# Patient Record
Sex: Female | Born: 1965
Health system: Southern US, Community
[De-identification: ages and names within clinical notes are randomized; demographics above are authoritative.]

## PROBLEM LIST (undated history)

## (undated) DIAGNOSIS — G629 Polyneuropathy, unspecified: Secondary | ICD-10-CM

## (undated) DIAGNOSIS — I10 Essential (primary) hypertension: Secondary | ICD-10-CM

## (undated) DIAGNOSIS — E785 Hyperlipidemia, unspecified: Secondary | ICD-10-CM

## (undated) HISTORY — DX: Essential (primary) hypertension: I10

## (undated) HISTORY — PX: CARPAL TUNNEL RELEASE: SHX101

## (undated) HISTORY — PX: TUBAL LIGATION: SHX77

## (undated) HISTORY — DX: Hyperlipidemia, unspecified: E78.5

## (undated) HISTORY — DX: Polyneuropathy, unspecified: G62.9

---

## 2014-12-12 DIAGNOSIS — I10 Essential (primary) hypertension: Secondary | ICD-10-CM | POA: Diagnosis not present

## 2014-12-12 DIAGNOSIS — I739 Peripheral vascular disease, unspecified: Secondary | ICD-10-CM | POA: Diagnosis not present

## 2014-12-12 DIAGNOSIS — I2510 Atherosclerotic heart disease of native coronary artery without angina pectoris: Secondary | ICD-10-CM | POA: Diagnosis not present

## 2014-12-12 DIAGNOSIS — F17210 Nicotine dependence, cigarettes, uncomplicated: Secondary | ICD-10-CM | POA: Diagnosis not present

## 2014-12-13 DIAGNOSIS — J449 Chronic obstructive pulmonary disease, unspecified: Secondary | ICD-10-CM | POA: Diagnosis not present

## 2014-12-13 DIAGNOSIS — G4733 Obstructive sleep apnea (adult) (pediatric): Secondary | ICD-10-CM | POA: Diagnosis not present

## 2014-12-13 DIAGNOSIS — J302 Other seasonal allergic rhinitis: Secondary | ICD-10-CM | POA: Diagnosis not present

## 2015-01-08 DIAGNOSIS — G8929 Other chronic pain: Secondary | ICD-10-CM | POA: Diagnosis not present

## 2015-01-08 DIAGNOSIS — M4600 Spinal enthesopathy, site unspecified: Secondary | ICD-10-CM | POA: Diagnosis not present

## 2015-01-08 DIAGNOSIS — M791 Myalgia: Secondary | ICD-10-CM | POA: Diagnosis not present

## 2015-01-08 DIAGNOSIS — M542 Cervicalgia: Secondary | ICD-10-CM | POA: Diagnosis not present

## 2015-01-08 DIAGNOSIS — M5416 Radiculopathy, lumbar region: Secondary | ICD-10-CM | POA: Diagnosis not present

## 2015-01-08 DIAGNOSIS — M25559 Pain in unspecified hip: Secondary | ICD-10-CM | POA: Diagnosis not present

## 2015-01-08 DIAGNOSIS — M5137 Other intervertebral disc degeneration, lumbosacral region: Secondary | ICD-10-CM | POA: Diagnosis not present

## 2015-01-08 DIAGNOSIS — M545 Low back pain: Secondary | ICD-10-CM | POA: Diagnosis not present

## 2015-01-16 DIAGNOSIS — G40901 Epilepsy, unspecified, not intractable, with status epilepticus: Secondary | ICD-10-CM | POA: Diagnosis not present

## 2015-01-16 DIAGNOSIS — Z8679 Personal history of other diseases of the circulatory system: Secondary | ICD-10-CM | POA: Diagnosis not present

## 2015-01-16 DIAGNOSIS — E11329 Type 2 diabetes mellitus with mild nonproliferative diabetic retinopathy without macular edema: Secondary | ICD-10-CM | POA: Diagnosis not present

## 2015-01-16 DIAGNOSIS — E139 Other specified diabetes mellitus without complications: Secondary | ICD-10-CM | POA: Diagnosis not present

## 2015-01-16 DIAGNOSIS — I10 Essential (primary) hypertension: Secondary | ICD-10-CM | POA: Diagnosis not present

## 2015-01-16 DIAGNOSIS — E785 Hyperlipidemia, unspecified: Secondary | ICD-10-CM | POA: Diagnosis not present

## 2015-01-16 DIAGNOSIS — R809 Proteinuria, unspecified: Secondary | ICD-10-CM | POA: Diagnosis not present

## 2015-01-16 DIAGNOSIS — E1139 Type 2 diabetes mellitus with other diabetic ophthalmic complication: Secondary | ICD-10-CM | POA: Diagnosis not present

## 2015-01-16 DIAGNOSIS — Z794 Long term (current) use of insulin: Secondary | ICD-10-CM | POA: Diagnosis not present

## 2015-01-16 DIAGNOSIS — E1129 Type 2 diabetes mellitus with other diabetic kidney complication: Secondary | ICD-10-CM | POA: Diagnosis not present

## 2015-01-16 DIAGNOSIS — J449 Chronic obstructive pulmonary disease, unspecified: Secondary | ICD-10-CM | POA: Diagnosis not present

## 2015-01-22 DIAGNOSIS — G4733 Obstructive sleep apnea (adult) (pediatric): Secondary | ICD-10-CM | POA: Diagnosis not present

## 2015-01-24 DIAGNOSIS — G4733 Obstructive sleep apnea (adult) (pediatric): Secondary | ICD-10-CM | POA: Diagnosis not present

## 2015-02-05 DIAGNOSIS — M5137 Other intervertebral disc degeneration, lumbosacral region: Secondary | ICD-10-CM | POA: Diagnosis not present

## 2015-02-05 DIAGNOSIS — M791 Myalgia: Secondary | ICD-10-CM | POA: Diagnosis not present

## 2015-02-05 DIAGNOSIS — M542 Cervicalgia: Secondary | ICD-10-CM | POA: Diagnosis not present

## 2015-02-05 DIAGNOSIS — M4600 Spinal enthesopathy, site unspecified: Secondary | ICD-10-CM | POA: Diagnosis not present

## 2015-02-05 DIAGNOSIS — M5416 Radiculopathy, lumbar region: Secondary | ICD-10-CM | POA: Diagnosis not present

## 2015-02-05 DIAGNOSIS — M545 Low back pain: Secondary | ICD-10-CM | POA: Diagnosis not present

## 2015-02-05 DIAGNOSIS — M25559 Pain in unspecified hip: Secondary | ICD-10-CM | POA: Diagnosis not present

## 2015-02-05 DIAGNOSIS — G8929 Other chronic pain: Secondary | ICD-10-CM | POA: Diagnosis not present

## 2015-02-14 DIAGNOSIS — Z8679 Personal history of other diseases of the circulatory system: Secondary | ICD-10-CM | POA: Diagnosis not present

## 2015-02-14 DIAGNOSIS — G40901 Epilepsy, unspecified, not intractable, with status epilepticus: Secondary | ICD-10-CM | POA: Diagnosis not present

## 2015-02-14 DIAGNOSIS — J449 Chronic obstructive pulmonary disease, unspecified: Secondary | ICD-10-CM | POA: Diagnosis not present

## 2015-02-14 DIAGNOSIS — E139 Other specified diabetes mellitus without complications: Secondary | ICD-10-CM | POA: Diagnosis not present

## 2015-02-20 DIAGNOSIS — G4733 Obstructive sleep apnea (adult) (pediatric): Secondary | ICD-10-CM | POA: Diagnosis not present

## 2015-02-20 DIAGNOSIS — J439 Emphysema, unspecified: Secondary | ICD-10-CM | POA: Diagnosis not present

## 2015-02-20 DIAGNOSIS — Z720 Tobacco use: Secondary | ICD-10-CM | POA: Diagnosis not present

## 2015-02-20 DIAGNOSIS — J449 Chronic obstructive pulmonary disease, unspecified: Secondary | ICD-10-CM | POA: Diagnosis not present

## 2015-02-20 DIAGNOSIS — J302 Other seasonal allergic rhinitis: Secondary | ICD-10-CM | POA: Diagnosis not present

## 2015-02-20 DIAGNOSIS — G4736 Sleep related hypoventilation in conditions classified elsewhere: Secondary | ICD-10-CM | POA: Diagnosis not present

## 2015-03-05 DIAGNOSIS — M791 Myalgia: Secondary | ICD-10-CM | POA: Diagnosis not present

## 2015-03-05 DIAGNOSIS — M25559 Pain in unspecified hip: Secondary | ICD-10-CM | POA: Diagnosis not present

## 2015-03-05 DIAGNOSIS — M5137 Other intervertebral disc degeneration, lumbosacral region: Secondary | ICD-10-CM | POA: Diagnosis not present

## 2015-03-05 DIAGNOSIS — G8929 Other chronic pain: Secondary | ICD-10-CM | POA: Diagnosis not present

## 2015-03-05 DIAGNOSIS — M4600 Spinal enthesopathy, site unspecified: Secondary | ICD-10-CM | POA: Diagnosis not present

## 2015-03-05 DIAGNOSIS — M545 Low back pain: Secondary | ICD-10-CM | POA: Diagnosis not present

## 2015-03-05 DIAGNOSIS — M5416 Radiculopathy, lumbar region: Secondary | ICD-10-CM | POA: Diagnosis not present

## 2015-03-17 DIAGNOSIS — G40901 Epilepsy, unspecified, not intractable, with status epilepticus: Secondary | ICD-10-CM | POA: Diagnosis not present

## 2015-03-17 DIAGNOSIS — J449 Chronic obstructive pulmonary disease, unspecified: Secondary | ICD-10-CM | POA: Diagnosis not present

## 2015-03-17 DIAGNOSIS — E139 Other specified diabetes mellitus without complications: Secondary | ICD-10-CM | POA: Diagnosis not present

## 2015-03-17 DIAGNOSIS — Z8679 Personal history of other diseases of the circulatory system: Secondary | ICD-10-CM | POA: Diagnosis not present

## 2015-03-23 DIAGNOSIS — G4733 Obstructive sleep apnea (adult) (pediatric): Secondary | ICD-10-CM | POA: Diagnosis not present

## 2015-04-02 DIAGNOSIS — G4733 Obstructive sleep apnea (adult) (pediatric): Secondary | ICD-10-CM | POA: Diagnosis not present

## 2015-04-08 DIAGNOSIS — M791 Myalgia: Secondary | ICD-10-CM | POA: Diagnosis not present

## 2015-04-08 DIAGNOSIS — M5137 Other intervertebral disc degeneration, lumbosacral region: Secondary | ICD-10-CM | POA: Diagnosis not present

## 2015-04-08 DIAGNOSIS — M545 Low back pain: Secondary | ICD-10-CM | POA: Diagnosis not present

## 2015-04-08 DIAGNOSIS — Z1389 Encounter for screening for other disorder: Secondary | ICD-10-CM | POA: Diagnosis not present

## 2015-04-08 DIAGNOSIS — M4600 Spinal enthesopathy, site unspecified: Secondary | ICD-10-CM | POA: Diagnosis not present

## 2015-04-08 DIAGNOSIS — G8929 Other chronic pain: Secondary | ICD-10-CM | POA: Diagnosis not present

## 2015-04-08 DIAGNOSIS — M5416 Radiculopathy, lumbar region: Secondary | ICD-10-CM | POA: Diagnosis not present

## 2015-04-08 DIAGNOSIS — M25559 Pain in unspecified hip: Secondary | ICD-10-CM | POA: Diagnosis not present

## 2015-04-08 DIAGNOSIS — F1120 Opioid dependence, uncomplicated: Secondary | ICD-10-CM | POA: Diagnosis not present

## 2015-04-16 DIAGNOSIS — E139 Other specified diabetes mellitus without complications: Secondary | ICD-10-CM | POA: Diagnosis not present

## 2015-04-16 DIAGNOSIS — G40901 Epilepsy, unspecified, not intractable, with status epilepticus: Secondary | ICD-10-CM | POA: Diagnosis not present

## 2015-04-16 DIAGNOSIS — J449 Chronic obstructive pulmonary disease, unspecified: Secondary | ICD-10-CM | POA: Diagnosis not present

## 2015-04-16 DIAGNOSIS — Z8679 Personal history of other diseases of the circulatory system: Secondary | ICD-10-CM | POA: Diagnosis not present

## 2015-04-22 DIAGNOSIS — G4733 Obstructive sleep apnea (adult) (pediatric): Secondary | ICD-10-CM | POA: Diagnosis not present

## 2015-05-09 DIAGNOSIS — M4600 Spinal enthesopathy, site unspecified: Secondary | ICD-10-CM | POA: Diagnosis not present

## 2015-05-09 DIAGNOSIS — M5137 Other intervertebral disc degeneration, lumbosacral region: Secondary | ICD-10-CM | POA: Diagnosis not present

## 2015-05-09 DIAGNOSIS — M5416 Radiculopathy, lumbar region: Secondary | ICD-10-CM | POA: Diagnosis not present

## 2015-05-09 DIAGNOSIS — G8929 Other chronic pain: Secondary | ICD-10-CM | POA: Diagnosis not present

## 2015-05-09 DIAGNOSIS — M25559 Pain in unspecified hip: Secondary | ICD-10-CM | POA: Diagnosis not present

## 2015-05-09 DIAGNOSIS — M545 Low back pain: Secondary | ICD-10-CM | POA: Diagnosis not present

## 2015-05-09 DIAGNOSIS — M791 Myalgia: Secondary | ICD-10-CM | POA: Diagnosis not present

## 2015-05-23 DIAGNOSIS — G4733 Obstructive sleep apnea (adult) (pediatric): Secondary | ICD-10-CM | POA: Diagnosis not present

## 2015-06-06 DIAGNOSIS — Z1389 Encounter for screening for other disorder: Secondary | ICD-10-CM | POA: Diagnosis not present

## 2015-06-06 DIAGNOSIS — M5416 Radiculopathy, lumbar region: Secondary | ICD-10-CM | POA: Diagnosis not present

## 2015-06-06 DIAGNOSIS — M791 Myalgia: Secondary | ICD-10-CM | POA: Diagnosis not present

## 2015-06-06 DIAGNOSIS — M4600 Spinal enthesopathy, site unspecified: Secondary | ICD-10-CM | POA: Diagnosis not present

## 2015-06-06 DIAGNOSIS — M25559 Pain in unspecified hip: Secondary | ICD-10-CM | POA: Diagnosis not present

## 2015-06-06 DIAGNOSIS — G8929 Other chronic pain: Secondary | ICD-10-CM | POA: Diagnosis not present

## 2015-06-06 DIAGNOSIS — M5137 Other intervertebral disc degeneration, lumbosacral region: Secondary | ICD-10-CM | POA: Diagnosis not present

## 2015-06-06 DIAGNOSIS — M545 Low back pain: Secondary | ICD-10-CM | POA: Diagnosis not present

## 2015-06-16 DIAGNOSIS — E139 Other specified diabetes mellitus without complications: Secondary | ICD-10-CM | POA: Diagnosis not present

## 2015-06-16 DIAGNOSIS — Z8679 Personal history of other diseases of the circulatory system: Secondary | ICD-10-CM | POA: Diagnosis not present

## 2015-06-16 DIAGNOSIS — G40901 Epilepsy, unspecified, not intractable, with status epilepticus: Secondary | ICD-10-CM | POA: Diagnosis not present

## 2015-06-16 DIAGNOSIS — J449 Chronic obstructive pulmonary disease, unspecified: Secondary | ICD-10-CM | POA: Diagnosis not present

## 2015-06-20 DIAGNOSIS — I2510 Atherosclerotic heart disease of native coronary artery without angina pectoris: Secondary | ICD-10-CM | POA: Diagnosis not present

## 2015-06-20 DIAGNOSIS — R569 Unspecified convulsions: Secondary | ICD-10-CM | POA: Diagnosis not present

## 2015-06-20 DIAGNOSIS — Z0000 Encounter for general adult medical examination without abnormal findings: Secondary | ICD-10-CM | POA: Diagnosis not present

## 2015-06-20 DIAGNOSIS — Z6825 Body mass index (BMI) 25.0-25.9, adult: Secondary | ICD-10-CM | POA: Diagnosis not present

## 2015-06-20 DIAGNOSIS — R52 Pain, unspecified: Secondary | ICD-10-CM | POA: Diagnosis not present

## 2015-06-20 DIAGNOSIS — E119 Type 2 diabetes mellitus without complications: Secondary | ICD-10-CM | POA: Diagnosis not present

## 2015-06-20 DIAGNOSIS — L739 Follicular disorder, unspecified: Secondary | ICD-10-CM | POA: Diagnosis not present

## 2015-06-22 DIAGNOSIS — G4733 Obstructive sleep apnea (adult) (pediatric): Secondary | ICD-10-CM | POA: Diagnosis not present

## 2015-07-17 DIAGNOSIS — J449 Chronic obstructive pulmonary disease, unspecified: Secondary | ICD-10-CM | POA: Diagnosis not present

## 2015-07-17 DIAGNOSIS — E139 Other specified diabetes mellitus without complications: Secondary | ICD-10-CM | POA: Diagnosis not present

## 2015-07-17 DIAGNOSIS — G40901 Epilepsy, unspecified, not intractable, with status epilepticus: Secondary | ICD-10-CM | POA: Diagnosis not present

## 2015-07-17 DIAGNOSIS — Z8679 Personal history of other diseases of the circulatory system: Secondary | ICD-10-CM | POA: Diagnosis not present

## 2015-07-19 DIAGNOSIS — G4733 Obstructive sleep apnea (adult) (pediatric): Secondary | ICD-10-CM | POA: Diagnosis not present

## 2015-07-23 DIAGNOSIS — I2510 Atherosclerotic heart disease of native coronary artery without angina pectoris: Secondary | ICD-10-CM | POA: Diagnosis not present

## 2015-07-23 DIAGNOSIS — G4733 Obstructive sleep apnea (adult) (pediatric): Secondary | ICD-10-CM | POA: Diagnosis not present

## 2015-07-23 DIAGNOSIS — R569 Unspecified convulsions: Secondary | ICD-10-CM | POA: Diagnosis not present

## 2015-07-23 DIAGNOSIS — R52 Pain, unspecified: Secondary | ICD-10-CM | POA: Diagnosis not present

## 2015-07-23 DIAGNOSIS — E119 Type 2 diabetes mellitus without complications: Secondary | ICD-10-CM | POA: Diagnosis not present

## 2015-07-23 DIAGNOSIS — M549 Dorsalgia, unspecified: Secondary | ICD-10-CM | POA: Diagnosis not present

## 2015-07-23 DIAGNOSIS — I519 Heart disease, unspecified: Secondary | ICD-10-CM | POA: Diagnosis not present

## 2015-07-23 DIAGNOSIS — F319 Bipolar disorder, unspecified: Secondary | ICD-10-CM | POA: Diagnosis not present

## 2015-07-23 DIAGNOSIS — K219 Gastro-esophageal reflux disease without esophagitis: Secondary | ICD-10-CM | POA: Diagnosis not present

## 2015-08-01 DIAGNOSIS — G4736 Sleep related hypoventilation in conditions classified elsewhere: Secondary | ICD-10-CM | POA: Diagnosis not present

## 2015-08-01 DIAGNOSIS — G4733 Obstructive sleep apnea (adult) (pediatric): Secondary | ICD-10-CM | POA: Diagnosis not present

## 2015-08-01 DIAGNOSIS — Z720 Tobacco use: Secondary | ICD-10-CM | POA: Diagnosis not present

## 2015-08-01 DIAGNOSIS — J439 Emphysema, unspecified: Secondary | ICD-10-CM | POA: Diagnosis not present

## 2015-08-01 DIAGNOSIS — J302 Other seasonal allergic rhinitis: Secondary | ICD-10-CM | POA: Diagnosis not present

## 2015-08-01 DIAGNOSIS — J449 Chronic obstructive pulmonary disease, unspecified: Secondary | ICD-10-CM | POA: Diagnosis not present

## 2015-08-07 DIAGNOSIS — E1165 Type 2 diabetes mellitus with hyperglycemia: Secondary | ICD-10-CM | POA: Diagnosis not present

## 2015-08-07 DIAGNOSIS — R110 Nausea: Secondary | ICD-10-CM | POA: Diagnosis not present

## 2015-08-07 DIAGNOSIS — E785 Hyperlipidemia, unspecified: Secondary | ICD-10-CM | POA: Diagnosis not present

## 2015-08-07 DIAGNOSIS — E11329 Type 2 diabetes mellitus with mild nonproliferative diabetic retinopathy without macular edema: Secondary | ICD-10-CM | POA: Diagnosis not present

## 2015-08-07 DIAGNOSIS — Z794 Long term (current) use of insulin: Secondary | ICD-10-CM | POA: Diagnosis not present

## 2015-08-07 DIAGNOSIS — R809 Proteinuria, unspecified: Secondary | ICD-10-CM | POA: Diagnosis not present

## 2015-08-07 DIAGNOSIS — I10 Essential (primary) hypertension: Secondary | ICD-10-CM | POA: Diagnosis not present

## 2015-08-17 DIAGNOSIS — G40901 Epilepsy, unspecified, not intractable, with status epilepticus: Secondary | ICD-10-CM | POA: Diagnosis not present

## 2015-08-17 DIAGNOSIS — Z8679 Personal history of other diseases of the circulatory system: Secondary | ICD-10-CM | POA: Diagnosis not present

## 2015-08-17 DIAGNOSIS — J449 Chronic obstructive pulmonary disease, unspecified: Secondary | ICD-10-CM | POA: Diagnosis not present

## 2015-08-17 DIAGNOSIS — E139 Other specified diabetes mellitus without complications: Secondary | ICD-10-CM | POA: Diagnosis not present

## 2015-08-22 DIAGNOSIS — Z6825 Body mass index (BMI) 25.0-25.9, adult: Secondary | ICD-10-CM | POA: Diagnosis not present

## 2015-08-22 DIAGNOSIS — M549 Dorsalgia, unspecified: Secondary | ICD-10-CM | POA: Diagnosis not present

## 2015-08-23 DIAGNOSIS — G4733 Obstructive sleep apnea (adult) (pediatric): Secondary | ICD-10-CM | POA: Diagnosis not present

## 2015-09-16 DIAGNOSIS — Z8679 Personal history of other diseases of the circulatory system: Secondary | ICD-10-CM | POA: Diagnosis not present

## 2015-09-16 DIAGNOSIS — E139 Other specified diabetes mellitus without complications: Secondary | ICD-10-CM | POA: Diagnosis not present

## 2015-09-16 DIAGNOSIS — J449 Chronic obstructive pulmonary disease, unspecified: Secondary | ICD-10-CM | POA: Diagnosis not present

## 2015-09-16 DIAGNOSIS — G40901 Epilepsy, unspecified, not intractable, with status epilepticus: Secondary | ICD-10-CM | POA: Diagnosis not present

## 2015-09-20 DIAGNOSIS — M549 Dorsalgia, unspecified: Secondary | ICD-10-CM | POA: Diagnosis not present

## 2015-09-22 DIAGNOSIS — G4733 Obstructive sleep apnea (adult) (pediatric): Secondary | ICD-10-CM | POA: Diagnosis not present

## 2015-10-17 DIAGNOSIS — E139 Other specified diabetes mellitus without complications: Secondary | ICD-10-CM | POA: Diagnosis not present

## 2015-10-17 DIAGNOSIS — Z8679 Personal history of other diseases of the circulatory system: Secondary | ICD-10-CM | POA: Diagnosis not present

## 2015-10-17 DIAGNOSIS — J449 Chronic obstructive pulmonary disease, unspecified: Secondary | ICD-10-CM | POA: Diagnosis not present

## 2015-10-17 DIAGNOSIS — G40901 Epilepsy, unspecified, not intractable, with status epilepticus: Secondary | ICD-10-CM | POA: Diagnosis not present

## 2015-10-21 DIAGNOSIS — M549 Dorsalgia, unspecified: Secondary | ICD-10-CM | POA: Diagnosis not present

## 2015-10-21 DIAGNOSIS — Z6826 Body mass index (BMI) 26.0-26.9, adult: Secondary | ICD-10-CM | POA: Diagnosis not present

## 2015-10-21 DIAGNOSIS — Z1389 Encounter for screening for other disorder: Secondary | ICD-10-CM | POA: Diagnosis not present

## 2015-10-21 DIAGNOSIS — I2510 Atherosclerotic heart disease of native coronary artery without angina pectoris: Secondary | ICD-10-CM | POA: Diagnosis not present

## 2015-10-21 DIAGNOSIS — K219 Gastro-esophageal reflux disease without esophagitis: Secondary | ICD-10-CM | POA: Diagnosis not present

## 2015-10-21 DIAGNOSIS — R0989 Other specified symptoms and signs involving the circulatory and respiratory systems: Secondary | ICD-10-CM | POA: Diagnosis not present

## 2015-10-23 DIAGNOSIS — G4733 Obstructive sleep apnea (adult) (pediatric): Secondary | ICD-10-CM | POA: Diagnosis not present

## 2015-11-04 DIAGNOSIS — K529 Noninfective gastroenteritis and colitis, unspecified: Secondary | ICD-10-CM | POA: Diagnosis not present

## 2015-11-14 DIAGNOSIS — G4733 Obstructive sleep apnea (adult) (pediatric): Secondary | ICD-10-CM | POA: Diagnosis not present

## 2015-11-16 DIAGNOSIS — G40901 Epilepsy, unspecified, not intractable, with status epilepticus: Secondary | ICD-10-CM | POA: Diagnosis not present

## 2015-11-16 DIAGNOSIS — J449 Chronic obstructive pulmonary disease, unspecified: Secondary | ICD-10-CM | POA: Diagnosis not present

## 2015-11-16 DIAGNOSIS — Z8679 Personal history of other diseases of the circulatory system: Secondary | ICD-10-CM | POA: Diagnosis not present

## 2015-11-16 DIAGNOSIS — E139 Other specified diabetes mellitus without complications: Secondary | ICD-10-CM | POA: Diagnosis not present

## 2015-11-20 DIAGNOSIS — M549 Dorsalgia, unspecified: Secondary | ICD-10-CM | POA: Diagnosis not present

## 2015-11-20 DIAGNOSIS — J42 Unspecified chronic bronchitis: Secondary | ICD-10-CM | POA: Diagnosis not present

## 2015-12-10 DIAGNOSIS — I6523 Occlusion and stenosis of bilateral carotid arteries: Secondary | ICD-10-CM | POA: Diagnosis not present

## 2015-12-17 DIAGNOSIS — J449 Chronic obstructive pulmonary disease, unspecified: Secondary | ICD-10-CM | POA: Diagnosis not present

## 2015-12-17 DIAGNOSIS — E139 Other specified diabetes mellitus without complications: Secondary | ICD-10-CM | POA: Diagnosis not present

## 2015-12-17 DIAGNOSIS — Z8679 Personal history of other diseases of the circulatory system: Secondary | ICD-10-CM | POA: Diagnosis not present

## 2015-12-17 DIAGNOSIS — G40901 Epilepsy, unspecified, not intractable, with status epilepticus: Secondary | ICD-10-CM | POA: Diagnosis not present

## 2015-12-20 DIAGNOSIS — M549 Dorsalgia, unspecified: Secondary | ICD-10-CM | POA: Diagnosis not present

## 2015-12-20 DIAGNOSIS — Z6826 Body mass index (BMI) 26.0-26.9, adult: Secondary | ICD-10-CM | POA: Diagnosis not present

## 2015-12-20 DIAGNOSIS — F319 Bipolar disorder, unspecified: Secondary | ICD-10-CM | POA: Diagnosis not present

## 2015-12-20 DIAGNOSIS — R569 Unspecified convulsions: Secondary | ICD-10-CM | POA: Diagnosis not present

## 2015-12-23 DIAGNOSIS — J449 Chronic obstructive pulmonary disease, unspecified: Secondary | ICD-10-CM | POA: Diagnosis not present

## 2015-12-23 DIAGNOSIS — J439 Emphysema, unspecified: Secondary | ICD-10-CM | POA: Diagnosis not present

## 2015-12-23 DIAGNOSIS — G4736 Sleep related hypoventilation in conditions classified elsewhere: Secondary | ICD-10-CM | POA: Diagnosis not present

## 2015-12-23 DIAGNOSIS — G4733 Obstructive sleep apnea (adult) (pediatric): Secondary | ICD-10-CM | POA: Diagnosis not present

## 2015-12-23 DIAGNOSIS — Z720 Tobacco use: Secondary | ICD-10-CM | POA: Diagnosis not present

## 2016-01-15 DIAGNOSIS — M5416 Radiculopathy, lumbar region: Secondary | ICD-10-CM | POA: Diagnosis not present

## 2016-01-15 DIAGNOSIS — G8929 Other chronic pain: Secondary | ICD-10-CM | POA: Diagnosis not present

## 2016-01-15 DIAGNOSIS — Z79899 Other long term (current) drug therapy: Secondary | ICD-10-CM | POA: Diagnosis not present

## 2016-01-17 DIAGNOSIS — E139 Other specified diabetes mellitus without complications: Secondary | ICD-10-CM | POA: Diagnosis not present

## 2016-01-17 DIAGNOSIS — J449 Chronic obstructive pulmonary disease, unspecified: Secondary | ICD-10-CM | POA: Diagnosis not present

## 2016-01-17 DIAGNOSIS — Z8679 Personal history of other diseases of the circulatory system: Secondary | ICD-10-CM | POA: Diagnosis not present

## 2016-01-17 DIAGNOSIS — G40901 Epilepsy, unspecified, not intractable, with status epilepticus: Secondary | ICD-10-CM | POA: Diagnosis not present

## 2016-02-10 DIAGNOSIS — G8929 Other chronic pain: Secondary | ICD-10-CM | POA: Diagnosis not present

## 2016-02-10 DIAGNOSIS — Z79899 Other long term (current) drug therapy: Secondary | ICD-10-CM | POA: Diagnosis not present

## 2016-02-10 DIAGNOSIS — M5416 Radiculopathy, lumbar region: Secondary | ICD-10-CM | POA: Diagnosis not present

## 2016-02-11 DIAGNOSIS — I10 Essential (primary) hypertension: Secondary | ICD-10-CM | POA: Diagnosis not present

## 2016-02-11 DIAGNOSIS — Z794 Long term (current) use of insulin: Secondary | ICD-10-CM | POA: Diagnosis not present

## 2016-02-11 DIAGNOSIS — E1165 Type 2 diabetes mellitus with hyperglycemia: Secondary | ICD-10-CM | POA: Diagnosis not present

## 2016-02-11 DIAGNOSIS — E782 Mixed hyperlipidemia: Secondary | ICD-10-CM | POA: Diagnosis not present

## 2016-02-14 DIAGNOSIS — J449 Chronic obstructive pulmonary disease, unspecified: Secondary | ICD-10-CM | POA: Diagnosis not present

## 2016-02-14 DIAGNOSIS — E139 Other specified diabetes mellitus without complications: Secondary | ICD-10-CM | POA: Diagnosis not present

## 2016-02-14 DIAGNOSIS — G40901 Epilepsy, unspecified, not intractable, with status epilepticus: Secondary | ICD-10-CM | POA: Diagnosis not present

## 2016-02-14 DIAGNOSIS — Z8679 Personal history of other diseases of the circulatory system: Secondary | ICD-10-CM | POA: Diagnosis not present

## 2016-03-12 DIAGNOSIS — M5416 Radiculopathy, lumbar region: Secondary | ICD-10-CM | POA: Diagnosis not present

## 2016-03-12 DIAGNOSIS — G8929 Other chronic pain: Secondary | ICD-10-CM | POA: Diagnosis not present

## 2016-03-12 DIAGNOSIS — G4733 Obstructive sleep apnea (adult) (pediatric): Secondary | ICD-10-CM | POA: Diagnosis not present

## 2016-03-12 DIAGNOSIS — Z79899 Other long term (current) drug therapy: Secondary | ICD-10-CM | POA: Diagnosis not present

## 2016-03-16 DIAGNOSIS — E139 Other specified diabetes mellitus without complications: Secondary | ICD-10-CM | POA: Diagnosis not present

## 2016-03-16 DIAGNOSIS — Z8679 Personal history of other diseases of the circulatory system: Secondary | ICD-10-CM | POA: Diagnosis not present

## 2016-03-16 DIAGNOSIS — G40901 Epilepsy, unspecified, not intractable, with status epilepticus: Secondary | ICD-10-CM | POA: Diagnosis not present

## 2016-03-16 DIAGNOSIS — J449 Chronic obstructive pulmonary disease, unspecified: Secondary | ICD-10-CM | POA: Diagnosis not present

## 2016-04-03 DIAGNOSIS — J309 Allergic rhinitis, unspecified: Secondary | ICD-10-CM | POA: Diagnosis not present

## 2016-04-03 DIAGNOSIS — G4733 Obstructive sleep apnea (adult) (pediatric): Secondary | ICD-10-CM | POA: Diagnosis not present

## 2016-04-03 DIAGNOSIS — Z720 Tobacco use: Secondary | ICD-10-CM | POA: Diagnosis not present

## 2016-04-03 DIAGNOSIS — J449 Chronic obstructive pulmonary disease, unspecified: Secondary | ICD-10-CM | POA: Diagnosis not present

## 2016-04-03 DIAGNOSIS — G4736 Sleep related hypoventilation in conditions classified elsewhere: Secondary | ICD-10-CM | POA: Diagnosis not present

## 2016-04-03 DIAGNOSIS — J439 Emphysema, unspecified: Secondary | ICD-10-CM | POA: Diagnosis not present

## 2016-04-10 DIAGNOSIS — G8929 Other chronic pain: Secondary | ICD-10-CM | POA: Diagnosis not present

## 2016-04-10 DIAGNOSIS — M5416 Radiculopathy, lumbar region: Secondary | ICD-10-CM | POA: Diagnosis not present

## 2016-04-10 DIAGNOSIS — Z79899 Other long term (current) drug therapy: Secondary | ICD-10-CM | POA: Diagnosis not present

## 2016-04-15 DIAGNOSIS — E139 Other specified diabetes mellitus without complications: Secondary | ICD-10-CM | POA: Diagnosis not present

## 2016-04-15 DIAGNOSIS — G40901 Epilepsy, unspecified, not intractable, with status epilepticus: Secondary | ICD-10-CM | POA: Diagnosis not present

## 2016-04-15 DIAGNOSIS — Z8679 Personal history of other diseases of the circulatory system: Secondary | ICD-10-CM | POA: Diagnosis not present

## 2016-04-15 DIAGNOSIS — J449 Chronic obstructive pulmonary disease, unspecified: Secondary | ICD-10-CM | POA: Diagnosis not present

## 2016-04-30 DIAGNOSIS — G4733 Obstructive sleep apnea (adult) (pediatric): Secondary | ICD-10-CM | POA: Diagnosis not present

## 2016-05-14 DIAGNOSIS — Z79899 Other long term (current) drug therapy: Secondary | ICD-10-CM | POA: Diagnosis not present

## 2016-05-14 DIAGNOSIS — G8929 Other chronic pain: Secondary | ICD-10-CM | POA: Diagnosis not present

## 2016-05-14 DIAGNOSIS — M5416 Radiculopathy, lumbar region: Secondary | ICD-10-CM | POA: Diagnosis not present

## 2016-05-16 DIAGNOSIS — J449 Chronic obstructive pulmonary disease, unspecified: Secondary | ICD-10-CM | POA: Diagnosis not present

## 2016-05-16 DIAGNOSIS — Z8679 Personal history of other diseases of the circulatory system: Secondary | ICD-10-CM | POA: Diagnosis not present

## 2016-05-16 DIAGNOSIS — E139 Other specified diabetes mellitus without complications: Secondary | ICD-10-CM | POA: Diagnosis not present

## 2016-05-16 DIAGNOSIS — G40901 Epilepsy, unspecified, not intractable, with status epilepticus: Secondary | ICD-10-CM | POA: Diagnosis not present

## 2016-06-12 DIAGNOSIS — G8929 Other chronic pain: Secondary | ICD-10-CM | POA: Diagnosis not present

## 2016-06-12 DIAGNOSIS — Z79899 Other long term (current) drug therapy: Secondary | ICD-10-CM | POA: Diagnosis not present

## 2016-06-12 DIAGNOSIS — M5416 Radiculopathy, lumbar region: Secondary | ICD-10-CM | POA: Diagnosis not present

## 2016-07-01 DIAGNOSIS — G4733 Obstructive sleep apnea (adult) (pediatric): Secondary | ICD-10-CM | POA: Diagnosis not present

## 2016-07-13 DIAGNOSIS — G8929 Other chronic pain: Secondary | ICD-10-CM | POA: Diagnosis not present

## 2016-07-13 DIAGNOSIS — Z79899 Other long term (current) drug therapy: Secondary | ICD-10-CM | POA: Diagnosis not present

## 2016-07-13 DIAGNOSIS — M5416 Radiculopathy, lumbar region: Secondary | ICD-10-CM | POA: Diagnosis not present

## 2016-07-14 DIAGNOSIS — E113293 Type 2 diabetes mellitus with mild nonproliferative diabetic retinopathy without macular edema, bilateral: Secondary | ICD-10-CM | POA: Diagnosis not present

## 2016-07-14 DIAGNOSIS — E1165 Type 2 diabetes mellitus with hyperglycemia: Secondary | ICD-10-CM | POA: Diagnosis not present

## 2016-07-14 DIAGNOSIS — I10 Essential (primary) hypertension: Secondary | ICD-10-CM | POA: Diagnosis not present

## 2016-07-14 DIAGNOSIS — E785 Hyperlipidemia, unspecified: Secondary | ICD-10-CM | POA: Diagnosis not present

## 2016-07-14 DIAGNOSIS — Z794 Long term (current) use of insulin: Secondary | ICD-10-CM | POA: Diagnosis not present

## 2016-07-16 DIAGNOSIS — G40901 Epilepsy, unspecified, not intractable, with status epilepticus: Secondary | ICD-10-CM | POA: Diagnosis not present

## 2016-07-16 DIAGNOSIS — J449 Chronic obstructive pulmonary disease, unspecified: Secondary | ICD-10-CM | POA: Diagnosis not present

## 2016-07-16 DIAGNOSIS — E139 Other specified diabetes mellitus without complications: Secondary | ICD-10-CM | POA: Diagnosis not present

## 2016-07-16 DIAGNOSIS — Z8679 Personal history of other diseases of the circulatory system: Secondary | ICD-10-CM | POA: Diagnosis not present

## 2016-08-11 DIAGNOSIS — M5416 Radiculopathy, lumbar region: Secondary | ICD-10-CM | POA: Diagnosis not present

## 2016-08-11 DIAGNOSIS — G8929 Other chronic pain: Secondary | ICD-10-CM | POA: Diagnosis not present

## 2016-08-11 DIAGNOSIS — Z79899 Other long term (current) drug therapy: Secondary | ICD-10-CM | POA: Diagnosis not present

## 2016-08-19 DIAGNOSIS — G629 Polyneuropathy, unspecified: Secondary | ICD-10-CM | POA: Diagnosis not present

## 2016-08-19 DIAGNOSIS — Z8669 Personal history of other diseases of the nervous system and sense organs: Secondary | ICD-10-CM | POA: Diagnosis not present

## 2016-08-19 DIAGNOSIS — K219 Gastro-esophageal reflux disease without esophagitis: Secondary | ICD-10-CM | POA: Diagnosis not present

## 2016-08-19 DIAGNOSIS — Z8673 Personal history of transient ischemic attack (TIA), and cerebral infarction without residual deficits: Secondary | ICD-10-CM | POA: Diagnosis not present

## 2016-08-19 DIAGNOSIS — F319 Bipolar disorder, unspecified: Secondary | ICD-10-CM | POA: Diagnosis not present

## 2016-08-19 DIAGNOSIS — J449 Chronic obstructive pulmonary disease, unspecified: Secondary | ICD-10-CM | POA: Diagnosis not present

## 2016-08-19 DIAGNOSIS — E119 Type 2 diabetes mellitus without complications: Secondary | ICD-10-CM | POA: Diagnosis not present

## 2016-08-19 DIAGNOSIS — F520 Hypoactive sexual desire disorder: Secondary | ICD-10-CM | POA: Diagnosis not present

## 2016-08-19 DIAGNOSIS — I252 Old myocardial infarction: Secondary | ICD-10-CM | POA: Diagnosis not present

## 2016-08-24 DIAGNOSIS — Z1231 Encounter for screening mammogram for malignant neoplasm of breast: Secondary | ICD-10-CM | POA: Diagnosis not present

## 2016-09-09 DIAGNOSIS — G4733 Obstructive sleep apnea (adult) (pediatric): Secondary | ICD-10-CM | POA: Diagnosis not present

## 2016-09-09 DIAGNOSIS — G4736 Sleep related hypoventilation in conditions classified elsewhere: Secondary | ICD-10-CM | POA: Diagnosis not present

## 2016-09-09 DIAGNOSIS — J439 Emphysema, unspecified: Secondary | ICD-10-CM | POA: Diagnosis not present

## 2016-09-09 DIAGNOSIS — F17210 Nicotine dependence, cigarettes, uncomplicated: Secondary | ICD-10-CM | POA: Diagnosis not present

## 2016-09-09 DIAGNOSIS — J449 Chronic obstructive pulmonary disease, unspecified: Secondary | ICD-10-CM | POA: Diagnosis not present

## 2016-09-10 DIAGNOSIS — G8929 Other chronic pain: Secondary | ICD-10-CM | POA: Diagnosis not present

## 2016-09-10 DIAGNOSIS — M5416 Radiculopathy, lumbar region: Secondary | ICD-10-CM | POA: Diagnosis not present

## 2016-09-10 DIAGNOSIS — Z79899 Other long term (current) drug therapy: Secondary | ICD-10-CM | POA: Diagnosis not present

## 2016-10-02 DIAGNOSIS — F3131 Bipolar disorder, current episode depressed, mild: Secondary | ICD-10-CM | POA: Diagnosis not present

## 2016-10-02 DIAGNOSIS — G40309 Generalized idiopathic epilepsy and epileptic syndromes, not intractable, without status epilepticus: Secondary | ICD-10-CM | POA: Diagnosis not present

## 2016-10-10 DIAGNOSIS — G8929 Other chronic pain: Secondary | ICD-10-CM | POA: Diagnosis not present

## 2016-10-10 DIAGNOSIS — Z79899 Other long term (current) drug therapy: Secondary | ICD-10-CM | POA: Diagnosis not present

## 2016-10-10 DIAGNOSIS — M5416 Radiculopathy, lumbar region: Secondary | ICD-10-CM | POA: Diagnosis not present

## 2016-11-07 DIAGNOSIS — M5416 Radiculopathy, lumbar region: Secondary | ICD-10-CM | POA: Diagnosis not present

## 2016-11-07 DIAGNOSIS — G8929 Other chronic pain: Secondary | ICD-10-CM | POA: Diagnosis not present

## 2016-11-07 DIAGNOSIS — Z79899 Other long term (current) drug therapy: Secondary | ICD-10-CM | POA: Diagnosis not present

## 2016-11-12 DIAGNOSIS — J449 Chronic obstructive pulmonary disease, unspecified: Secondary | ICD-10-CM | POA: Diagnosis not present

## 2016-11-18 DIAGNOSIS — E663 Overweight: Secondary | ICD-10-CM | POA: Diagnosis not present

## 2016-11-18 DIAGNOSIS — G629 Polyneuropathy, unspecified: Secondary | ICD-10-CM | POA: Diagnosis not present

## 2016-11-18 DIAGNOSIS — E119 Type 2 diabetes mellitus without complications: Secondary | ICD-10-CM | POA: Diagnosis not present

## 2016-11-18 DIAGNOSIS — Z6825 Body mass index (BMI) 25.0-25.9, adult: Secondary | ICD-10-CM | POA: Diagnosis not present

## 2016-11-18 DIAGNOSIS — G8929 Other chronic pain: Secondary | ICD-10-CM | POA: Diagnosis not present

## 2016-11-18 DIAGNOSIS — Z1389 Encounter for screening for other disorder: Secondary | ICD-10-CM | POA: Diagnosis not present

## 2016-12-09 DIAGNOSIS — G8929 Other chronic pain: Secondary | ICD-10-CM | POA: Diagnosis not present

## 2016-12-09 DIAGNOSIS — M5416 Radiculopathy, lumbar region: Secondary | ICD-10-CM | POA: Diagnosis not present

## 2016-12-09 DIAGNOSIS — Z79899 Other long term (current) drug therapy: Secondary | ICD-10-CM | POA: Diagnosis not present

## 2016-12-11 DIAGNOSIS — J0100 Acute maxillary sinusitis, unspecified: Secondary | ICD-10-CM | POA: Diagnosis not present

## 2016-12-15 DIAGNOSIS — E113293 Type 2 diabetes mellitus with mild nonproliferative diabetic retinopathy without macular edema, bilateral: Secondary | ICD-10-CM | POA: Diagnosis not present

## 2016-12-15 DIAGNOSIS — E785 Hyperlipidemia, unspecified: Secondary | ICD-10-CM | POA: Diagnosis not present

## 2016-12-15 DIAGNOSIS — E1165 Type 2 diabetes mellitus with hyperglycemia: Secondary | ICD-10-CM | POA: Diagnosis not present

## 2016-12-15 DIAGNOSIS — Z794 Long term (current) use of insulin: Secondary | ICD-10-CM | POA: Diagnosis not present

## 2016-12-15 DIAGNOSIS — I10 Essential (primary) hypertension: Secondary | ICD-10-CM | POA: Diagnosis not present

## 2016-12-30 DIAGNOSIS — G4733 Obstructive sleep apnea (adult) (pediatric): Secondary | ICD-10-CM | POA: Diagnosis not present

## 2016-12-30 DIAGNOSIS — J439 Emphysema, unspecified: Secondary | ICD-10-CM | POA: Diagnosis not present

## 2016-12-30 DIAGNOSIS — F17210 Nicotine dependence, cigarettes, uncomplicated: Secondary | ICD-10-CM | POA: Diagnosis not present

## 2016-12-30 DIAGNOSIS — J449 Chronic obstructive pulmonary disease, unspecified: Secondary | ICD-10-CM | POA: Diagnosis not present

## 2016-12-30 DIAGNOSIS — G4736 Sleep related hypoventilation in conditions classified elsewhere: Secondary | ICD-10-CM | POA: Diagnosis not present

## 2017-01-07 DIAGNOSIS — G8929 Other chronic pain: Secondary | ICD-10-CM | POA: Diagnosis not present

## 2017-01-07 DIAGNOSIS — Z79899 Other long term (current) drug therapy: Secondary | ICD-10-CM | POA: Diagnosis not present

## 2017-01-07 DIAGNOSIS — M5416 Radiculopathy, lumbar region: Secondary | ICD-10-CM | POA: Diagnosis not present

## 2017-01-22 DIAGNOSIS — G4733 Obstructive sleep apnea (adult) (pediatric): Secondary | ICD-10-CM | POA: Diagnosis not present

## 2017-02-10 DIAGNOSIS — M5416 Radiculopathy, lumbar region: Secondary | ICD-10-CM | POA: Diagnosis not present

## 2017-02-10 DIAGNOSIS — G8929 Other chronic pain: Secondary | ICD-10-CM | POA: Diagnosis not present

## 2017-03-09 DIAGNOSIS — J0100 Acute maxillary sinusitis, unspecified: Secondary | ICD-10-CM | POA: Diagnosis not present

## 2017-03-12 DIAGNOSIS — M5416 Radiculopathy, lumbar region: Secondary | ICD-10-CM | POA: Diagnosis not present

## 2017-03-12 DIAGNOSIS — Z79899 Other long term (current) drug therapy: Secondary | ICD-10-CM | POA: Diagnosis not present

## 2017-03-12 DIAGNOSIS — G8929 Other chronic pain: Secondary | ICD-10-CM | POA: Diagnosis not present

## 2017-03-25 DIAGNOSIS — F332 Major depressive disorder, recurrent severe without psychotic features: Secondary | ICD-10-CM | POA: Diagnosis not present

## 2017-03-30 DIAGNOSIS — F329 Major depressive disorder, single episode, unspecified: Secondary | ICD-10-CM | POA: Diagnosis not present

## 2017-03-30 DIAGNOSIS — F332 Major depressive disorder, recurrent severe without psychotic features: Secondary | ICD-10-CM | POA: Diagnosis not present

## 2017-04-12 DIAGNOSIS — G8929 Other chronic pain: Secondary | ICD-10-CM | POA: Diagnosis not present

## 2017-04-12 DIAGNOSIS — Z79899 Other long term (current) drug therapy: Secondary | ICD-10-CM | POA: Diagnosis not present

## 2017-04-12 DIAGNOSIS — M5416 Radiculopathy, lumbar region: Secondary | ICD-10-CM | POA: Diagnosis not present

## 2017-04-15 DIAGNOSIS — F332 Major depressive disorder, recurrent severe without psychotic features: Secondary | ICD-10-CM | POA: Diagnosis not present

## 2017-04-20 DIAGNOSIS — J449 Chronic obstructive pulmonary disease, unspecified: Secondary | ICD-10-CM | POA: Diagnosis not present

## 2017-04-20 DIAGNOSIS — J309 Allergic rhinitis, unspecified: Secondary | ICD-10-CM | POA: Diagnosis not present

## 2017-04-20 DIAGNOSIS — Z9981 Dependence on supplemental oxygen: Secondary | ICD-10-CM | POA: Diagnosis not present

## 2017-04-20 DIAGNOSIS — F17200 Nicotine dependence, unspecified, uncomplicated: Secondary | ICD-10-CM | POA: Diagnosis not present

## 2017-04-20 DIAGNOSIS — I10 Essential (primary) hypertension: Secondary | ICD-10-CM | POA: Diagnosis not present

## 2017-04-20 DIAGNOSIS — J209 Acute bronchitis, unspecified: Secondary | ICD-10-CM | POA: Diagnosis not present

## 2017-04-27 DIAGNOSIS — G4733 Obstructive sleep apnea (adult) (pediatric): Secondary | ICD-10-CM | POA: Diagnosis not present

## 2017-04-28 DIAGNOSIS — Z794 Long term (current) use of insulin: Secondary | ICD-10-CM | POA: Diagnosis not present

## 2017-04-28 DIAGNOSIS — E785 Hyperlipidemia, unspecified: Secondary | ICD-10-CM | POA: Diagnosis not present

## 2017-04-28 DIAGNOSIS — I10 Essential (primary) hypertension: Secondary | ICD-10-CM | POA: Diagnosis not present

## 2017-04-28 DIAGNOSIS — E113293 Type 2 diabetes mellitus with mild nonproliferative diabetic retinopathy without macular edema, bilateral: Secondary | ICD-10-CM | POA: Diagnosis not present

## 2017-04-28 DIAGNOSIS — E1165 Type 2 diabetes mellitus with hyperglycemia: Secondary | ICD-10-CM | POA: Diagnosis not present

## 2017-05-13 DIAGNOSIS — Z79899 Other long term (current) drug therapy: Secondary | ICD-10-CM | POA: Diagnosis not present

## 2017-05-13 DIAGNOSIS — G8929 Other chronic pain: Secondary | ICD-10-CM | POA: Diagnosis not present

## 2017-05-13 DIAGNOSIS — M5416 Radiculopathy, lumbar region: Secondary | ICD-10-CM | POA: Diagnosis not present

## 2017-05-21 DIAGNOSIS — E663 Overweight: Secondary | ICD-10-CM | POA: Diagnosis not present

## 2017-05-21 DIAGNOSIS — Z6826 Body mass index (BMI) 26.0-26.9, adult: Secondary | ICD-10-CM | POA: Diagnosis not present

## 2017-05-21 DIAGNOSIS — R569 Unspecified convulsions: Secondary | ICD-10-CM | POA: Diagnosis not present

## 2017-05-21 DIAGNOSIS — F319 Bipolar disorder, unspecified: Secondary | ICD-10-CM | POA: Diagnosis not present

## 2017-05-21 DIAGNOSIS — I2510 Atherosclerotic heart disease of native coronary artery without angina pectoris: Secondary | ICD-10-CM | POA: Diagnosis not present

## 2017-05-25 DIAGNOSIS — E1165 Type 2 diabetes mellitus with hyperglycemia: Secondary | ICD-10-CM | POA: Diagnosis not present

## 2017-05-25 DIAGNOSIS — Z794 Long term (current) use of insulin: Secondary | ICD-10-CM | POA: Diagnosis not present

## 2017-06-03 DIAGNOSIS — E119 Type 2 diabetes mellitus without complications: Secondary | ICD-10-CM | POA: Diagnosis not present

## 2017-06-03 DIAGNOSIS — I2510 Atherosclerotic heart disease of native coronary artery without angina pectoris: Secondary | ICD-10-CM | POA: Diagnosis not present

## 2017-06-03 DIAGNOSIS — J208 Acute bronchitis due to other specified organisms: Secondary | ICD-10-CM | POA: Diagnosis not present

## 2017-06-03 DIAGNOSIS — Z6826 Body mass index (BMI) 26.0-26.9, adult: Secondary | ICD-10-CM | POA: Diagnosis not present

## 2017-06-08 DIAGNOSIS — G8929 Other chronic pain: Secondary | ICD-10-CM | POA: Diagnosis not present

## 2017-06-08 DIAGNOSIS — M5416 Radiculopathy, lumbar region: Secondary | ICD-10-CM | POA: Diagnosis not present

## 2017-06-08 DIAGNOSIS — Z79899 Other long term (current) drug therapy: Secondary | ICD-10-CM | POA: Diagnosis not present

## 2017-07-01 DIAGNOSIS — Z1211 Encounter for screening for malignant neoplasm of colon: Secondary | ICD-10-CM | POA: Diagnosis not present

## 2017-07-01 DIAGNOSIS — Z6826 Body mass index (BMI) 26.0-26.9, adult: Secondary | ICD-10-CM | POA: Diagnosis not present

## 2017-07-01 DIAGNOSIS — R569 Unspecified convulsions: Secondary | ICD-10-CM | POA: Diagnosis not present

## 2017-07-01 DIAGNOSIS — E119 Type 2 diabetes mellitus without complications: Secondary | ICD-10-CM | POA: Diagnosis not present

## 2017-07-01 DIAGNOSIS — M25532 Pain in left wrist: Secondary | ICD-10-CM | POA: Diagnosis not present

## 2017-07-08 DIAGNOSIS — Z79899 Other long term (current) drug therapy: Secondary | ICD-10-CM | POA: Diagnosis not present

## 2017-07-08 DIAGNOSIS — M5416 Radiculopathy, lumbar region: Secondary | ICD-10-CM | POA: Diagnosis not present

## 2017-07-08 DIAGNOSIS — G8929 Other chronic pain: Secondary | ICD-10-CM | POA: Diagnosis not present

## 2017-07-13 ENCOUNTER — Encounter: Payer: Self-pay | Admitting: Neurology

## 2017-07-14 DIAGNOSIS — Z1211 Encounter for screening for malignant neoplasm of colon: Secondary | ICD-10-CM | POA: Diagnosis not present

## 2017-07-14 DIAGNOSIS — Z1212 Encounter for screening for malignant neoplasm of rectum: Secondary | ICD-10-CM | POA: Diagnosis not present

## 2017-07-26 DIAGNOSIS — G4733 Obstructive sleep apnea (adult) (pediatric): Secondary | ICD-10-CM | POA: Diagnosis not present

## 2017-08-08 DIAGNOSIS — I10 Essential (primary) hypertension: Secondary | ICD-10-CM | POA: Diagnosis not present

## 2017-08-08 DIAGNOSIS — G8929 Other chronic pain: Secondary | ICD-10-CM | POA: Diagnosis not present

## 2017-08-08 DIAGNOSIS — M5416 Radiculopathy, lumbar region: Secondary | ICD-10-CM | POA: Diagnosis not present

## 2017-08-08 DIAGNOSIS — Z79899 Other long term (current) drug therapy: Secondary | ICD-10-CM | POA: Diagnosis not present

## 2017-08-08 DIAGNOSIS — G629 Polyneuropathy, unspecified: Secondary | ICD-10-CM | POA: Diagnosis not present

## 2017-08-12 DIAGNOSIS — H2513 Age-related nuclear cataract, bilateral: Secondary | ICD-10-CM | POA: Diagnosis not present

## 2017-08-12 DIAGNOSIS — Z794 Long term (current) use of insulin: Secondary | ICD-10-CM | POA: Diagnosis not present

## 2017-08-12 DIAGNOSIS — E113292 Type 2 diabetes mellitus with mild nonproliferative diabetic retinopathy without macular edema, left eye: Secondary | ICD-10-CM | POA: Diagnosis not present

## 2017-08-12 DIAGNOSIS — H11153 Pinguecula, bilateral: Secondary | ICD-10-CM | POA: Diagnosis not present

## 2017-08-12 DIAGNOSIS — H524 Presbyopia: Secondary | ICD-10-CM | POA: Diagnosis not present

## 2017-08-12 DIAGNOSIS — Z7984 Long term (current) use of oral hypoglycemic drugs: Secondary | ICD-10-CM | POA: Diagnosis not present

## 2017-08-12 DIAGNOSIS — H52203 Unspecified astigmatism, bilateral: Secondary | ICD-10-CM | POA: Diagnosis not present

## 2017-08-27 DIAGNOSIS — E785 Hyperlipidemia, unspecified: Secondary | ICD-10-CM | POA: Diagnosis not present

## 2017-08-27 DIAGNOSIS — F17210 Nicotine dependence, cigarettes, uncomplicated: Secondary | ICD-10-CM | POA: Diagnosis not present

## 2017-08-27 DIAGNOSIS — B850 Pediculosis due to Pediculus humanus capitis: Secondary | ICD-10-CM | POA: Diagnosis not present

## 2017-08-27 DIAGNOSIS — L0100 Impetigo, unspecified: Secondary | ICD-10-CM | POA: Diagnosis not present

## 2017-08-27 DIAGNOSIS — R590 Localized enlarged lymph nodes: Secondary | ICD-10-CM | POA: Diagnosis not present

## 2017-08-27 DIAGNOSIS — J0100 Acute maxillary sinusitis, unspecified: Secondary | ICD-10-CM | POA: Diagnosis not present

## 2017-08-27 DIAGNOSIS — R21 Rash and other nonspecific skin eruption: Secondary | ICD-10-CM | POA: Diagnosis not present

## 2017-08-27 DIAGNOSIS — E119 Type 2 diabetes mellitus without complications: Secondary | ICD-10-CM | POA: Diagnosis not present

## 2017-08-27 DIAGNOSIS — I252 Old myocardial infarction: Secondary | ICD-10-CM | POA: Diagnosis not present

## 2017-08-27 DIAGNOSIS — G40909 Epilepsy, unspecified, not intractable, without status epilepticus: Secondary | ICD-10-CM | POA: Diagnosis not present

## 2017-08-31 DIAGNOSIS — E1165 Type 2 diabetes mellitus with hyperglycemia: Secondary | ICD-10-CM | POA: Diagnosis not present

## 2017-08-31 DIAGNOSIS — I10 Essential (primary) hypertension: Secondary | ICD-10-CM | POA: Diagnosis not present

## 2017-08-31 DIAGNOSIS — E113293 Type 2 diabetes mellitus with mild nonproliferative diabetic retinopathy without macular edema, bilateral: Secondary | ICD-10-CM | POA: Diagnosis not present

## 2017-08-31 DIAGNOSIS — E785 Hyperlipidemia, unspecified: Secondary | ICD-10-CM | POA: Diagnosis not present

## 2017-08-31 DIAGNOSIS — Z794 Long term (current) use of insulin: Secondary | ICD-10-CM | POA: Diagnosis not present

## 2017-09-06 ENCOUNTER — Encounter: Payer: Self-pay | Admitting: Neurology

## 2017-09-07 ENCOUNTER — Ambulatory Visit: Payer: Self-pay | Admitting: Neurology

## 2017-09-10 DIAGNOSIS — G8929 Other chronic pain: Secondary | ICD-10-CM | POA: Diagnosis not present

## 2017-09-10 DIAGNOSIS — Z79899 Other long term (current) drug therapy: Secondary | ICD-10-CM | POA: Diagnosis not present

## 2017-09-10 DIAGNOSIS — M5416 Radiculopathy, lumbar region: Secondary | ICD-10-CM | POA: Diagnosis not present

## 2017-09-27 DIAGNOSIS — K219 Gastro-esophageal reflux disease without esophagitis: Secondary | ICD-10-CM | POA: Diagnosis not present

## 2017-09-27 DIAGNOSIS — R42 Dizziness and giddiness: Secondary | ICD-10-CM | POA: Diagnosis not present

## 2017-09-27 DIAGNOSIS — R1012 Left upper quadrant pain: Secondary | ICD-10-CM | POA: Diagnosis not present

## 2017-09-27 DIAGNOSIS — R1013 Epigastric pain: Secondary | ICD-10-CM | POA: Diagnosis not present

## 2017-09-27 DIAGNOSIS — R3912 Poor urinary stream: Secondary | ICD-10-CM | POA: Diagnosis not present

## 2017-09-27 DIAGNOSIS — R10815 Periumbilic abdominal tenderness: Secondary | ICD-10-CM | POA: Diagnosis not present

## 2017-09-27 DIAGNOSIS — R39198 Other difficulties with micturition: Secondary | ICD-10-CM | POA: Diagnosis not present

## 2017-09-27 DIAGNOSIS — M545 Low back pain: Secondary | ICD-10-CM | POA: Diagnosis not present

## 2017-09-27 DIAGNOSIS — R112 Nausea with vomiting, unspecified: Secondary | ICD-10-CM | POA: Diagnosis not present

## 2017-09-28 DIAGNOSIS — R112 Nausea with vomiting, unspecified: Secondary | ICD-10-CM | POA: Diagnosis not present

## 2017-10-01 DIAGNOSIS — Z1231 Encounter for screening mammogram for malignant neoplasm of breast: Secondary | ICD-10-CM | POA: Diagnosis not present

## 2017-10-01 DIAGNOSIS — Z23 Encounter for immunization: Secondary | ICD-10-CM | POA: Diagnosis not present

## 2017-10-01 DIAGNOSIS — M8580 Other specified disorders of bone density and structure, unspecified site: Secondary | ICD-10-CM | POA: Diagnosis not present

## 2017-10-01 DIAGNOSIS — E119 Type 2 diabetes mellitus without complications: Secondary | ICD-10-CM | POA: Diagnosis not present

## 2017-10-01 DIAGNOSIS — I2510 Atherosclerotic heart disease of native coronary artery without angina pectoris: Secondary | ICD-10-CM | POA: Diagnosis not present

## 2017-10-01 DIAGNOSIS — Z6823 Body mass index (BMI) 23.0-23.9, adult: Secondary | ICD-10-CM | POA: Diagnosis not present

## 2017-10-01 DIAGNOSIS — R195 Other fecal abnormalities: Secondary | ICD-10-CM | POA: Diagnosis not present

## 2017-10-01 DIAGNOSIS — R569 Unspecified convulsions: Secondary | ICD-10-CM | POA: Diagnosis not present

## 2017-10-12 DIAGNOSIS — M5416 Radiculopathy, lumbar region: Secondary | ICD-10-CM | POA: Diagnosis not present

## 2017-10-12 DIAGNOSIS — Z79899 Other long term (current) drug therapy: Secondary | ICD-10-CM | POA: Diagnosis not present

## 2017-10-12 DIAGNOSIS — G8929 Other chronic pain: Secondary | ICD-10-CM | POA: Diagnosis not present

## 2017-10-16 DIAGNOSIS — E118 Type 2 diabetes mellitus with unspecified complications: Secondary | ICD-10-CM | POA: Diagnosis not present

## 2017-10-16 DIAGNOSIS — R201 Hypoesthesia of skin: Secondary | ICD-10-CM | POA: Diagnosis not present

## 2017-10-16 DIAGNOSIS — Z7901 Long term (current) use of anticoagulants: Secondary | ICD-10-CM | POA: Diagnosis not present

## 2017-10-16 DIAGNOSIS — R062 Wheezing: Secondary | ICD-10-CM | POA: Diagnosis not present

## 2017-10-16 DIAGNOSIS — S61201A Unspecified open wound of left index finger without damage to nail, initial encounter: Secondary | ICD-10-CM | POA: Diagnosis not present

## 2017-10-25 DIAGNOSIS — M810 Age-related osteoporosis without current pathological fracture: Secondary | ICD-10-CM | POA: Diagnosis not present

## 2017-10-25 DIAGNOSIS — M85852 Other specified disorders of bone density and structure, left thigh: Secondary | ICD-10-CM | POA: Diagnosis not present

## 2017-10-25 DIAGNOSIS — Z1231 Encounter for screening mammogram for malignant neoplasm of breast: Secondary | ICD-10-CM | POA: Diagnosis not present

## 2017-10-26 DIAGNOSIS — G4733 Obstructive sleep apnea (adult) (pediatric): Secondary | ICD-10-CM | POA: Diagnosis not present

## 2017-11-04 DIAGNOSIS — K5900 Constipation, unspecified: Secondary | ICD-10-CM | POA: Diagnosis not present

## 2017-11-04 DIAGNOSIS — Z7901 Long term (current) use of anticoagulants: Secondary | ICD-10-CM | POA: Diagnosis not present

## 2017-11-04 DIAGNOSIS — K219 Gastro-esophageal reflux disease without esophagitis: Secondary | ICD-10-CM | POA: Diagnosis not present

## 2017-11-10 DIAGNOSIS — M5416 Radiculopathy, lumbar region: Secondary | ICD-10-CM | POA: Diagnosis not present

## 2017-11-10 DIAGNOSIS — G8929 Other chronic pain: Secondary | ICD-10-CM | POA: Diagnosis not present

## 2017-11-10 DIAGNOSIS — Z79899 Other long term (current) drug therapy: Secondary | ICD-10-CM | POA: Diagnosis not present

## 2017-11-19 DIAGNOSIS — J0100 Acute maxillary sinusitis, unspecified: Secondary | ICD-10-CM | POA: Diagnosis not present

## 2017-11-24 ENCOUNTER — Ambulatory Visit: Payer: Self-pay | Admitting: Neurology

## 2017-12-04 DIAGNOSIS — J0100 Acute maxillary sinusitis, unspecified: Secondary | ICD-10-CM | POA: Diagnosis not present

## 2017-12-10 DIAGNOSIS — M5416 Radiculopathy, lumbar region: Secondary | ICD-10-CM | POA: Diagnosis not present

## 2017-12-10 DIAGNOSIS — Z79899 Other long term (current) drug therapy: Secondary | ICD-10-CM | POA: Diagnosis not present

## 2017-12-10 DIAGNOSIS — G8929 Other chronic pain: Secondary | ICD-10-CM | POA: Diagnosis not present

## 2017-12-29 DIAGNOSIS — F319 Bipolar disorder, unspecified: Secondary | ICD-10-CM | POA: Diagnosis not present

## 2017-12-29 DIAGNOSIS — R569 Unspecified convulsions: Secondary | ICD-10-CM | POA: Diagnosis not present

## 2017-12-29 DIAGNOSIS — I2510 Atherosclerotic heart disease of native coronary artery without angina pectoris: Secondary | ICD-10-CM | POA: Diagnosis not present

## 2017-12-29 DIAGNOSIS — N941 Unspecified dyspareunia: Secondary | ICD-10-CM | POA: Diagnosis not present

## 2017-12-29 DIAGNOSIS — M549 Dorsalgia, unspecified: Secondary | ICD-10-CM | POA: Diagnosis not present

## 2017-12-29 DIAGNOSIS — M8580 Other specified disorders of bone density and structure, unspecified site: Secondary | ICD-10-CM | POA: Diagnosis not present

## 2017-12-29 DIAGNOSIS — Z6827 Body mass index (BMI) 27.0-27.9, adult: Secondary | ICD-10-CM | POA: Diagnosis not present

## 2017-12-29 DIAGNOSIS — E119 Type 2 diabetes mellitus without complications: Secondary | ICD-10-CM | POA: Diagnosis not present

## 2017-12-29 DIAGNOSIS — J449 Chronic obstructive pulmonary disease, unspecified: Secondary | ICD-10-CM | POA: Diagnosis not present

## 2018-01-13 DIAGNOSIS — Z79899 Other long term (current) drug therapy: Secondary | ICD-10-CM | POA: Diagnosis not present

## 2018-01-13 DIAGNOSIS — M5416 Radiculopathy, lumbar region: Secondary | ICD-10-CM | POA: Diagnosis not present

## 2018-01-13 DIAGNOSIS — G8929 Other chronic pain: Secondary | ICD-10-CM | POA: Diagnosis not present

## 2018-01-24 DIAGNOSIS — J209 Acute bronchitis, unspecified: Secondary | ICD-10-CM | POA: Diagnosis not present

## 2018-02-07 DIAGNOSIS — G4733 Obstructive sleep apnea (adult) (pediatric): Secondary | ICD-10-CM | POA: Diagnosis not present

## 2018-02-09 ENCOUNTER — Ambulatory Visit: Payer: Medicare HMO | Admitting: Neurology

## 2018-02-09 ENCOUNTER — Other Ambulatory Visit: Payer: Self-pay

## 2018-02-09 ENCOUNTER — Encounter: Payer: Self-pay | Admitting: Neurology

## 2018-02-09 VITALS — BP 114/70 | HR 80 | Ht 62.0 in | Wt 161.0 lb

## 2018-02-09 DIAGNOSIS — G40309 Generalized idiopathic epilepsy and epileptic syndromes, not intractable, without status epilepticus: Secondary | ICD-10-CM | POA: Diagnosis not present

## 2018-02-09 DIAGNOSIS — R200 Anesthesia of skin: Secondary | ICD-10-CM | POA: Diagnosis not present

## 2018-02-09 NOTE — Progress Notes (Signed)
NEUROLOGY CONSULTATION NOTE  Kimberly Li MRN: [id number] DOB: [birth year]  Referring provider: Jeanie Sewer, NP Primary care provider: Jeanie Sewer, NP  Reason for consult:  Establish care for seizures  Thank you for your kind referral of Kimberly Li for consultation of the above symptoms. Although her history is well known to you, please allow me to reiterate it for the purpose of our medical record. Records and images were personally reviewed where available.  HISTORY OF PRESENT ILLNESS: This is a pleasant 52 year old right-handed woman with a history of hypertension, hyperlipidemia, neuropathy, anxiety, depression, presenting to establish care for seizures. Records from her prior neurology practice were reviewed. Seizures started in her 18s. She recalls the first episode where she was at a parade and she started shaking. She recalls seeing herself shaking, then she was set down on the ground and lost consciousness. She reports being started on carbamazepine and lamotrigine at the same time. She takes carbamazepine 200mg  1.5 tabs in AM and Lamotrigine 150mg  with 25mg  tab every evening. She denies any side effects to current medications. She reports the last seizure was 10 years ago while living in [place], [place], she was at the [employer] and felt lightheaded. She told her husband they needed to go, she reports that seizure continued until she was in the hospital. She reports this was "really bad," with her "feet drawed." She has occasional body twitches during the day, she has difficulty peeling potatoes because her hands don't work. She denies any staring/unresponsive episodes, gaps in time, olfactory/gustatory hallucinations, rising epigastric sensation. She has occasional dizziness. She fell this morning putting her pants on after she lost balance. She has occasional numbness in both legs and left arm. She has neck and back pain, radiating up her head. She takes gabapentin  300mg  3-4 times a day for neuropathy, which helps.   Epilepsy Risk Factors:  She was born preterm at only 3 lbs and was "in the hospital my whole first year." Otherwise she reports normal early development.  There is no history of febrile convulsions, CNS infections such as meningitis/encephalitis, significant traumatic brain injury, neurosurgical procedures, or family history of seizures.  Diagnostic Data: No EEG or prior imaging available for review  PAST MEDICAL HISTORY: Past Medical History:  Diagnosis Date  . Hyperlipidemia   . Hypertension   . Neuropathy     PAST SURGICAL HISTORY: Past Surgical History:  Procedure Laterality Date  . CARPAL TUNNEL RELEASE    . CESAREAN SECTION     x2  . TUBAL LIGATION      MEDICATIONS: Current Outpatient Medications on File Prior to Visit  Medication Sig Dispense Refill  . albuterol (PROVENTIL HFA;VENTOLIN HFA) 108 (90 Base) MCG/ACT inhaler Inhale into the lungs every 6 (six) hours as needed for wheezing or shortness of breath.    Marland Kitchen aspirin 325 MG EC tablet Take 325 mg by mouth daily.    . budesonide-formoterol (SYMBICORT) 160-4.5 MCG/ACT inhaler Inhale 2 puffs into the lungs 2 (two) times daily.    . carbamazepine (TEGRETOL) 200 MG tablet     . clopidogrel (PLAVIX) 75 MG tablet     . fluticasone (VERAMYST) 27.5 MCG/SPRAY nasal spray Place 2 sprays into the nose daily.    Marland Kitchen gabapentin (NEURONTIN) 300 MG capsule     . hydrOXYzine (ATARAX/VISTARIL) 50 MG tablet Take 50 mg by mouth 3 (three) times daily as needed.    . lamoTRIgine (LAMICTAL) 150 MG tablet     .  lamoTRIgine (LAMICTAL) 25 MG tablet     . lisinopril (PRINIVIL,ZESTRIL) 5 MG tablet     . metFORMIN (GLUCOPHAGE) 1000 MG tablet     . metoprolol tartrate (LOPRESSOR) 25 MG tablet     . montelukast (SINGULAIR) 10 MG tablet     . omeprazole (PRILOSEC) 20 MG capsule Take 20 mg by mouth daily.    . Oxycodone HCl 10 MG TABS     . sertraline (ZOLOFT) 50 MG tablet Take 50 mg by mouth  daily.    . simvastatin (ZOCOR) 20 MG tablet     . traZODone (DESYREL) 50 MG tablet     . cholecalciferol (VITAMIN D) 1000 units tablet Take 1,000 Units by mouth daily.     No current facility-administered medications on file prior to visit.     ALLERGIES: Allergies  Allergen Reactions  . Penicillins Anaphylaxis, Other (See Comments), Rash and Swelling    Other reaction(s): Other (See Comments) Other reaction(s): Other (See Comments) Unknown Unknown Other reaction(s): Other (See Comments) Unknown Other reaction(s): Other (See Comments) Unknown Unknown Other reaction(s): Other (See Comments) Unknown Other reaction(s): Other (See Comments) Unknown Unknown Other reaction(s): Other (See Comments) Unknown Other reaction(s): Other (See Comments) Unknown Unknown Other reaction(s): Other (See Comments) Other reaction(s): Other (See Comments) Unknown Unknown Other reaction(s): Other (See Comments) Unknown Other reaction(s): Other (See Comments) Unknown Unknown     FAMILY HISTORY: History reviewed. No pertinent family history.  SOCIAL HISTORY: Social History   Socioeconomic History  . Marital status: Married    Spouse name: Not on file  . Number of children: Not on file  . Years of education: Not on file  . Highest education level: Not on file  Social Needs  . Financial resource strain: Not on file  . Food insecurity - worry: Not on file  . Food insecurity - inability: Not on file  . Transportation needs - medical: Not on file  . Transportation needs - non-medical: Not on file  Occupational History  . Not on file  Tobacco Use  . Smoking status: Current Every Day Smoker  . Smokeless tobacco: Never Used  Substance and Sexual Activity  . Alcohol use: No    Frequency: Never  . Drug use: No  . Sexual activity: Not on file  Other Topics Concern  . Not on file  Social History Narrative  . Not on file    REVIEW OF SYSTEMS: Constitutional: No fevers, chills,  or sweats, no generalized fatigue, change in appetite Eyes: No visual changes, double vision, eye pain Ear, nose and throat: No hearing loss, ear pain, nasal congestion, sore throat Cardiovascular: No chest pain, palpitations Respiratory:  No shortness of breath at rest or with exertion, wheezes GastrointestinaI: No nausea, vomiting, diarrhea, abdominal pain, fecal incontinence Genitourinary:  No dysuria, urinary retention or frequency Musculoskeletal:  + neck pain, back pain Integumentary: No rash, pruritus, skin lesions Neurological: as above Psychiatric: No depression, insomnia, anxiety Endocrine: No palpitations, fatigue, diaphoresis, mood swings, change in appetite, change in weight, increased thirst Hematologic/Lymphatic:  No anemia, purpura, petechiae. Allergic/Immunologic: no itchy/runny eyes, nasal congestion, recent allergic reactions, rashes  PHYSICAL EXAM: Vitals:   02/09/18 1405  BP: 114/70  Pulse: 80  SpO2: 94%   General: No acute distress Head:  Normocephalic/atraumatic Eyes: Fundoscopic exam shows bilateral sharp discs, no vessel changes, exudates, or hemorrhages Neck: supple, no paraspinal tenderness, full range of motion Back: No paraspinal tenderness Heart: regular rate and rhythm Lungs: Clear to auscultation bilaterally. Vascular: No  carotid bruits. Skin/Extremities: No rash, no edema Neurological Exam: Mental status: alert and oriented to person, place, and time, no dysarthria or aphasia, Fund of knowledge is appropriate.  Recent and remote memory are intact.  Attention and concentration are normal.    Able to name objects and repeat phrases. Cranial nerves: CN I: not tested CN II: pupils equal, round and reactive to light, visual fields intact, fundi unremarkable. CN III, IV, VI:  full range of motion, no nystagmus, no ptosis CN V: facial sensation intact CN VII: upper and lower face symmetric CN VIII: hearing intact to finger rub CN IX, X: gag intact,  uvula midline CN XI: sternocleidomastoid and trapezius muscles intact CN XII: tongue midline Bulk & Tone: normal, no fasciculations. Motor: 5/5 throughout with no pronator drift. Sensation: intact to light touch, cold, pin, vibration and joint position sense.  No extinction to double simultaneous stimulation.  Romberg test negative Deep Tendon Reflexes: +2 throughout, no ankle clonus Plantar responses: downgoing bilaterally Cerebellar: no incoordination on finger to nose, heel to shin. No dysdiadochokinesia Gait: narrow-based and steady, able to tandem walk adequately. Tremor: none  IMPRESSION: This is a pleasant 52 year old right-handed woman with a history of seizures since age 61. She describes body twitching and generalized convulsions. She has not had any convulsions in 10 years, but continues to report body twitching. MRI brain with and without contrast and a 1-hour EEG will be ordered to further classify her seizures. Continue Lamotrigine 175mg  qhs and Tegretol 200mg  1.5 tabs daily. [place] driving laws were discussed with the patient, and she knows to stop driving after a seizure, until 6 months seizure-free. She will follow-up in 6 months and knows to call for any changes.   Thank you for allowing me to participate in the care of this patient. Please do not hesitate to call for any questions or concerns.   Ellouise Newer, M.D.  CC: Jeanie Sewer, NP

## 2018-02-09 NOTE — Patient Instructions (Signed)
1. Schedule MRI brain with and without contrast 2. Schedule 1-hour EEG 3. Continue Lamotrigine 175mg  daily and Tegretol 200mg  1.5 tablets daily 4. Follow-up in 6 months, call for any changes  Seizure Precautions: 1. If medication has been prescribed for you to prevent seizures, take it exactly as directed.  Do not stop taking the medicine without talking to your doctor first, even if you have not had a seizure in a long time.   2. Avoid activities in which a seizure would cause danger to yourself or to others.  Don't operate dangerous machinery, swim alone, or climb in high or dangerous places, such as on ladders, roofs, or girders.  Do not drive unless your doctor says you may.  3. If you have any warning that you may have a seizure, lay down in a safe place where you can't hurt yourself.    4.  No driving for 6 months from last seizure, as per [facility].   Please refer to the following link on the [employer] website for more information: http://[email or web address].org/[email or web address]/driving/drivingu.cfm   5.  Maintain good sleep hygiene. Avoid alcohol.  6.  Contact your doctor if you have any problems that may be related to the medicine you are taking.  7.  Call 911 and bring the patient back to the ED if:        A.  The seizure lasts longer than 5 minutes.       B.  The patient doesn't awaken shortly after the seizure  C.  The patient has new problems such as difficulty seeing, speaking or moving  D.  The patient was injured during the seizure  E.  The patient has a temperature over 102 F (39C)  F.  The patient vomited and now is having trouble breathing

## 2018-02-10 DIAGNOSIS — M5416 Radiculopathy, lumbar region: Secondary | ICD-10-CM | POA: Diagnosis not present

## 2018-02-10 DIAGNOSIS — G8929 Other chronic pain: Secondary | ICD-10-CM | POA: Diagnosis not present

## 2018-02-10 DIAGNOSIS — Z79899 Other long term (current) drug therapy: Secondary | ICD-10-CM | POA: Diagnosis not present

## 2018-02-14 ENCOUNTER — Encounter: Payer: Self-pay | Admitting: Neurology

## 2018-02-14 ENCOUNTER — Ambulatory Visit (INDEPENDENT_AMBULATORY_CARE_PROVIDER_SITE_OTHER): Payer: Medicare HMO | Admitting: Neurology

## 2018-02-14 DIAGNOSIS — G40309 Generalized idiopathic epilepsy and epileptic syndromes, not intractable, without status epilepticus: Secondary | ICD-10-CM

## 2018-02-14 DIAGNOSIS — R200 Anesthesia of skin: Secondary | ICD-10-CM

## 2018-02-17 DIAGNOSIS — E113293 Type 2 diabetes mellitus with mild nonproliferative diabetic retinopathy without macular edema, bilateral: Secondary | ICD-10-CM | POA: Diagnosis not present

## 2018-02-17 DIAGNOSIS — E1165 Type 2 diabetes mellitus with hyperglycemia: Secondary | ICD-10-CM | POA: Diagnosis not present

## 2018-02-17 DIAGNOSIS — I10 Essential (primary) hypertension: Secondary | ICD-10-CM | POA: Diagnosis not present

## 2018-02-17 DIAGNOSIS — Z794 Long term (current) use of insulin: Secondary | ICD-10-CM | POA: Diagnosis not present

## 2018-02-17 DIAGNOSIS — E785 Hyperlipidemia, unspecified: Secondary | ICD-10-CM | POA: Diagnosis not present

## 2018-02-18 NOTE — Procedures (Signed)
ELECTROENCEPHALOGRAM REPORT  Date of Study: 02/14/2018  Patient's Name: Kimberly Li MRN: [id number] Date of Birth: 10/26/1966  Referring Provider: Dr. Ellouise Newer  Clinical History: This is a 52 year old woman with a history of convulsions and recurrent body twitching. EEG for classification.  Medications: LAMICTAL 150 MG tablet    LAMICTAL 25 MG tablet    TEGRETOL 200 MG tablet    NEURONTIN 300 MG capsule  PROVENTIL HFA;VENTOLIN HFA 108 (90 Base) MCG/ACT inhaler  aspirin 325 MG EC tablet SYMBICORT 160-4.5 MCG/ACT inhaler   PLAVIX 75 MG tablet    VERAMYST 27.5 MCG/SPRAY nasal spray   ATARAX/VISTARIL 50 MG tablet  PRINIVIL,ZESTRIL 5 MG tablet   GLUCOPHAGE 1000 MG tablet   LOPRESSOR 25 MG tablet    SINGULAIR 10 MG tablet    PRILOSEC 20 MG capsule  Oxycodone HCl 10 MG TABS    ZOLOFT 50 MG tablet   ZOCOR 20 MG tablet    DESYREL 50 MG tablet   VITAMIN D 1000 units tablet  Technical Summary: A multichannel digital 1-hour EEG recording measured by the international 10-20 system with electrodes applied with paste and impedances below 5000 ohms performed in our laboratory with EKG monitoring in an awake and asleep patient.  Hyperventilation and photic stimulation were performed.  The digital EEG was referentially recorded, reformatted, and digitally filtered in a variety of bipolar and referential montages for optimal display.    Description: The patient is awake and asleep during the recording.  During maximal wakefulness, there is a symmetric, medium voltage 10 Hz posterior dominant rhythm that attenuates with eye opening.  The record is symmetric.  During drowsiness and sleep, there is an increase in theta slowing of the background.  Vertex waves and symmetric sleep spindles were seen.  Hyperventilation and photic stimulation did not elicit any abnormalities.  There were no epileptiform discharges or electrographic seizures seen.    EKG lead was  unremarkable.  Impression: This 1-hour awake and asleep EEG is normal.    Clinical Correlation: A normal EEG does not exclude a clinical diagnosis of epilepsy. Typical events were not captured.  If further clinical questions remain, prolonged EEG may be helpful.  Clinical correlation is advised.   Ellouise Newer, M.D.

## 2018-02-23 ENCOUNTER — Telehealth: Payer: Self-pay

## 2018-02-23 NOTE — Telephone Encounter (Signed)
Called pt.  No answer.  VM not set up.  Unable to relay message below. 

## 2018-02-23 NOTE — Telephone Encounter (Signed)
-----   Message from Cameron Sprang, MD sent at 02/18/2018  3:22 PM EDT ----- Pls let her know brain wave test was normal, continue all seizure medications. Thanks

## 2018-02-24 NOTE — Telephone Encounter (Signed)
Spoke with pt's husband, Fulton Reek, relaying message below.

## 2018-03-10 ENCOUNTER — Ambulatory Visit
Admission: RE | Admit: 2018-03-10 | Discharge: 2018-03-10 | Disposition: A | Discharge status: Home / Self Care | Payer: Medicare HMO | Source: Ambulatory Visit | Attending: Neurology | Admitting: Neurology

## 2018-03-10 DIAGNOSIS — G40309 Generalized idiopathic epilepsy and epileptic syndromes, not intractable, without status epilepticus: Secondary | ICD-10-CM

## 2018-03-10 DIAGNOSIS — R200 Anesthesia of skin: Secondary | ICD-10-CM

## 2018-03-10 DIAGNOSIS — R569 Unspecified convulsions: Secondary | ICD-10-CM | POA: Diagnosis not present

## 2018-03-10 MED ORDER — GADOBENATE DIMEGLUMINE 529 MG/ML IV SOLN
14.0000 mL | Freq: Once | INTRAVENOUS | Status: AC | PRN
  Administered 2018-03-10: 14 mL via INTRAVENOUS

## 2018-03-11 ENCOUNTER — Telehealth: Payer: Self-pay

## 2018-03-11 NOTE — Telephone Encounter (Signed)
Called pt.  No answer.  Voicemail asked for mailbox key.  Unable to relay message below.

## 2018-03-11 NOTE — Telephone Encounter (Signed)
-----   Message from Cameron Sprang, MD sent at 03/11/2018 12:26 PM EDT ----- Pls let her know the MRI brain did not show any evidence of tumor, stroke, or bleed. It showed age-related changes. Thanks

## 2018-03-14 DIAGNOSIS — M5416 Radiculopathy, lumbar region: Secondary | ICD-10-CM | POA: Diagnosis not present

## 2018-03-14 DIAGNOSIS — G8929 Other chronic pain: Secondary | ICD-10-CM | POA: Diagnosis not present

## 2018-03-14 DIAGNOSIS — Z79899 Other long term (current) drug therapy: Secondary | ICD-10-CM | POA: Diagnosis not present

## 2018-03-14 DIAGNOSIS — I70213 Atherosclerosis of native arteries of extremities with intermittent claudication, bilateral legs: Secondary | ICD-10-CM | POA: Diagnosis not present

## 2018-03-30 DIAGNOSIS — J209 Acute bronchitis, unspecified: Secondary | ICD-10-CM | POA: Diagnosis not present

## 2018-03-30 DIAGNOSIS — Z720 Tobacco use: Secondary | ICD-10-CM | POA: Diagnosis not present

## 2018-03-30 DIAGNOSIS — H6092 Unspecified otitis externa, left ear: Secondary | ICD-10-CM | POA: Diagnosis not present

## 2018-04-08 DIAGNOSIS — Z1231 Encounter for screening mammogram for malignant neoplasm of breast: Secondary | ICD-10-CM | POA: Diagnosis not present

## 2018-04-08 DIAGNOSIS — Z6828 Body mass index (BMI) 28.0-28.9, adult: Secondary | ICD-10-CM | POA: Diagnosis not present

## 2018-04-08 DIAGNOSIS — Z9181 History of falling: Secondary | ICD-10-CM | POA: Diagnosis not present

## 2018-04-08 DIAGNOSIS — R569 Unspecified convulsions: Secondary | ICD-10-CM | POA: Diagnosis not present

## 2018-04-08 DIAGNOSIS — N941 Unspecified dyspareunia: Secondary | ICD-10-CM | POA: Diagnosis not present

## 2018-04-08 DIAGNOSIS — E663 Overweight: Secondary | ICD-10-CM | POA: Diagnosis not present

## 2018-04-08 DIAGNOSIS — F319 Bipolar disorder, unspecified: Secondary | ICD-10-CM | POA: Diagnosis not present

## 2018-04-08 DIAGNOSIS — L989 Disorder of the skin and subcutaneous tissue, unspecified: Secondary | ICD-10-CM | POA: Diagnosis not present

## 2018-04-08 DIAGNOSIS — Z1331 Encounter for screening for depression: Secondary | ICD-10-CM | POA: Diagnosis not present

## 2018-04-08 DIAGNOSIS — E785 Hyperlipidemia, unspecified: Secondary | ICD-10-CM | POA: Diagnosis not present

## 2018-04-08 DIAGNOSIS — Z0000 Encounter for general adult medical examination without abnormal findings: Secondary | ICD-10-CM | POA: Diagnosis not present

## 2018-04-08 DIAGNOSIS — I2510 Atherosclerotic heart disease of native coronary artery without angina pectoris: Secondary | ICD-10-CM | POA: Diagnosis not present

## 2018-04-13 DIAGNOSIS — M5416 Radiculopathy, lumbar region: Secondary | ICD-10-CM | POA: Diagnosis not present

## 2018-04-13 DIAGNOSIS — Z79899 Other long term (current) drug therapy: Secondary | ICD-10-CM | POA: Diagnosis not present

## 2018-04-13 DIAGNOSIS — G8929 Other chronic pain: Secondary | ICD-10-CM | POA: Diagnosis not present

## 2018-05-11 DIAGNOSIS — Z79899 Other long term (current) drug therapy: Secondary | ICD-10-CM | POA: Diagnosis not present

## 2018-05-11 DIAGNOSIS — G8929 Other chronic pain: Secondary | ICD-10-CM | POA: Diagnosis not present

## 2018-05-11 DIAGNOSIS — M5416 Radiculopathy, lumbar region: Secondary | ICD-10-CM | POA: Diagnosis not present

## 2018-05-16 DIAGNOSIS — I70213 Atherosclerosis of native arteries of extremities with intermittent claudication, bilateral legs: Secondary | ICD-10-CM | POA: Diagnosis not present

## 2018-05-16 DIAGNOSIS — I2510 Atherosclerotic heart disease of native coronary artery without angina pectoris: Secondary | ICD-10-CM | POA: Diagnosis not present

## 2018-05-16 DIAGNOSIS — R600 Localized edema: Secondary | ICD-10-CM | POA: Diagnosis not present

## 2018-06-02 DIAGNOSIS — E1140 Type 2 diabetes mellitus with diabetic neuropathy, unspecified: Secondary | ICD-10-CM | POA: Diagnosis not present

## 2018-06-02 DIAGNOSIS — B351 Tinea unguium: Secondary | ICD-10-CM | POA: Diagnosis not present

## 2018-06-02 DIAGNOSIS — M79674 Pain in right toe(s): Secondary | ICD-10-CM | POA: Diagnosis not present

## 2018-06-02 DIAGNOSIS — M79675 Pain in left toe(s): Secondary | ICD-10-CM | POA: Diagnosis not present

## 2018-06-08 DIAGNOSIS — Z6825 Body mass index (BMI) 25.0-25.9, adult: Secondary | ICD-10-CM | POA: Diagnosis not present

## 2018-06-08 DIAGNOSIS — Z79899 Other long term (current) drug therapy: Secondary | ICD-10-CM | POA: Diagnosis not present

## 2018-06-08 DIAGNOSIS — M5416 Radiculopathy, lumbar region: Secondary | ICD-10-CM | POA: Diagnosis not present

## 2018-06-08 DIAGNOSIS — G8929 Other chronic pain: Secondary | ICD-10-CM | POA: Diagnosis not present

## 2018-06-29 DIAGNOSIS — E1140 Type 2 diabetes mellitus with diabetic neuropathy, unspecified: Secondary | ICD-10-CM | POA: Diagnosis not present

## 2018-07-04 DIAGNOSIS — Z794 Long term (current) use of insulin: Secondary | ICD-10-CM | POA: Diagnosis not present

## 2018-07-04 DIAGNOSIS — Z951 Presence of aortocoronary bypass graft: Secondary | ICD-10-CM | POA: Diagnosis not present

## 2018-07-04 DIAGNOSIS — Z8669 Personal history of other diseases of the nervous system and sense organs: Secondary | ICD-10-CM | POA: Diagnosis not present

## 2018-07-04 DIAGNOSIS — E113293 Type 2 diabetes mellitus with mild nonproliferative diabetic retinopathy without macular edema, bilateral: Secondary | ICD-10-CM | POA: Diagnosis not present

## 2018-07-04 DIAGNOSIS — E1165 Type 2 diabetes mellitus with hyperglycemia: Secondary | ICD-10-CM | POA: Diagnosis not present

## 2018-07-04 DIAGNOSIS — I10 Essential (primary) hypertension: Secondary | ICD-10-CM | POA: Diagnosis not present

## 2018-07-04 DIAGNOSIS — E785 Hyperlipidemia, unspecified: Secondary | ICD-10-CM | POA: Diagnosis not present

## 2018-07-06 DIAGNOSIS — Z6826 Body mass index (BMI) 26.0-26.9, adult: Secondary | ICD-10-CM | POA: Diagnosis not present

## 2018-07-06 DIAGNOSIS — Z79899 Other long term (current) drug therapy: Secondary | ICD-10-CM | POA: Diagnosis not present

## 2018-07-06 DIAGNOSIS — G8929 Other chronic pain: Secondary | ICD-10-CM | POA: Diagnosis not present

## 2018-07-06 DIAGNOSIS — M5416 Radiculopathy, lumbar region: Secondary | ICD-10-CM | POA: Diagnosis not present

## 2018-08-03 DIAGNOSIS — Z6825 Body mass index (BMI) 25.0-25.9, adult: Secondary | ICD-10-CM | POA: Diagnosis not present

## 2018-08-03 DIAGNOSIS — M5416 Radiculopathy, lumbar region: Secondary | ICD-10-CM | POA: Diagnosis not present

## 2018-08-03 DIAGNOSIS — G8929 Other chronic pain: Secondary | ICD-10-CM | POA: Diagnosis not present

## 2018-08-03 DIAGNOSIS — Z79899 Other long term (current) drug therapy: Secondary | ICD-10-CM | POA: Diagnosis not present

## 2018-08-16 ENCOUNTER — Ambulatory Visit: Payer: Medicare HMO | Admitting: Neurology

## 2018-09-01 DIAGNOSIS — L600 Ingrowing nail: Secondary | ICD-10-CM | POA: Diagnosis not present

## 2018-09-01 DIAGNOSIS — E1140 Type 2 diabetes mellitus with diabetic neuropathy, unspecified: Secondary | ICD-10-CM | POA: Diagnosis not present

## 2018-09-01 DIAGNOSIS — M79675 Pain in left toe(s): Secondary | ICD-10-CM | POA: Diagnosis not present

## 2018-09-01 DIAGNOSIS — B351 Tinea unguium: Secondary | ICD-10-CM | POA: Diagnosis not present

## 2018-09-06 DIAGNOSIS — Z6825 Body mass index (BMI) 25.0-25.9, adult: Secondary | ICD-10-CM | POA: Diagnosis not present

## 2018-09-06 DIAGNOSIS — R29898 Other symptoms and signs involving the musculoskeletal system: Secondary | ICD-10-CM | POA: Diagnosis not present

## 2018-09-06 DIAGNOSIS — Z79899 Other long term (current) drug therapy: Secondary | ICD-10-CM | POA: Diagnosis not present

## 2018-09-06 DIAGNOSIS — M5416 Radiculopathy, lumbar region: Secondary | ICD-10-CM | POA: Diagnosis not present

## 2018-09-06 DIAGNOSIS — G8929 Other chronic pain: Secondary | ICD-10-CM | POA: Diagnosis not present

## 2018-10-04 DIAGNOSIS — M5416 Radiculopathy, lumbar region: Secondary | ICD-10-CM | POA: Diagnosis not present

## 2018-10-04 DIAGNOSIS — Z79899 Other long term (current) drug therapy: Secondary | ICD-10-CM | POA: Diagnosis not present

## 2018-10-04 DIAGNOSIS — Z79891 Long term (current) use of opiate analgesic: Secondary | ICD-10-CM | POA: Diagnosis not present

## 2018-10-04 DIAGNOSIS — G8929 Other chronic pain: Secondary | ICD-10-CM | POA: Diagnosis not present

## 2018-10-18 DIAGNOSIS — M6588 Other synovitis and tenosynovitis, other site: Secondary | ICD-10-CM | POA: Diagnosis not present

## 2018-10-18 DIAGNOSIS — G5602 Carpal tunnel syndrome, left upper limb: Secondary | ICD-10-CM | POA: Diagnosis not present

## 2018-10-18 DIAGNOSIS — M65311 Trigger thumb, right thumb: Secondary | ICD-10-CM | POA: Diagnosis not present

## 2018-11-01 DIAGNOSIS — M5416 Radiculopathy, lumbar region: Secondary | ICD-10-CM | POA: Diagnosis not present

## 2018-11-01 DIAGNOSIS — Z79899 Other long term (current) drug therapy: Secondary | ICD-10-CM | POA: Diagnosis not present

## 2018-11-01 DIAGNOSIS — G8929 Other chronic pain: Secondary | ICD-10-CM | POA: Diagnosis not present

## 2018-11-01 DIAGNOSIS — I10 Essential (primary) hypertension: Secondary | ICD-10-CM | POA: Diagnosis not present

## 2018-11-07 IMAGING — MR MR HEAD WO/W CM
13 series · 48 of 48 positions shown · IV contrast (multihance)
Comparison: No prior brain imaging is available for review.

CLINICAL DATA: History of body twitching and generalized
convulsions, for several years.

EXAM:
MRI HEAD WITHOUT AND WITH CONTRAST
TECHNIQUE: Multiplanar, multiecho pulse sequences of the brain and surrounding
structures were obtained without and with intravenous contrast.
CONTRAST:  14mL MULTIHANCE GADOBENATE DIMEGLUMINE 529 MG/ML IV SOLN
Creatinine was obtained on site at [HOSPITAL] at [HOSPITAL].
Results: Creatinine 0.7 mg/dL.

[Series 2: t1_se_sag · sagittal · 5.0mm · 0.45mm/px · 1 of 21 slices shown]
[im 1/21]
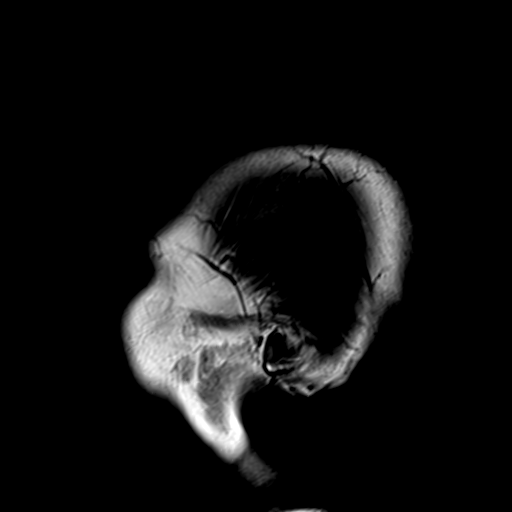

[Series 3: ep2d_diff_(id)_trace · axial · 3.0mm · 1.80mm/px · z∈[-59,+84]mm · 6 of 95 slices shown]
[im 1/95]
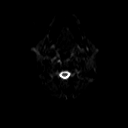
[im 19/95]
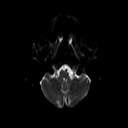
[im 38/95]
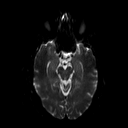
[im 57/95]
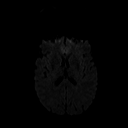
[im 76/95]
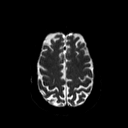
[im 95/95]
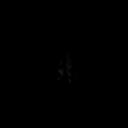

[Series 4: ep2d_diff_(id)_trace_adc · axial · 3.0mm · 1.80mm/px · z∈[-59,+84]mm · 3 of 47 slices shown]
[im 1/47]
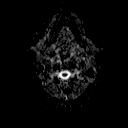
[im 24/47]
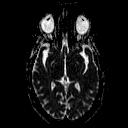
[im 47/47]
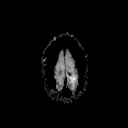

[Series 5: ep2d_diff_cor · coronal · 5.0mm · 1.77mm/px · 3 of 48 slices shown]
[im 1/48]
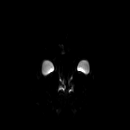
[im 24/48]
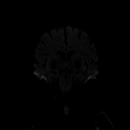
[im 48/48]
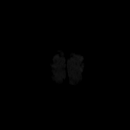

[Series 6: ep2d_diff_cor_adc · coronal · 5.0mm · 1.77mm/px · 2 of 24 slices shown]
[im 1/24]
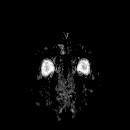
[im 24/24]
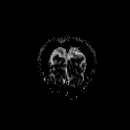

[Series 7: FLAIR · axial · 3.0mm · 0.45mm/px · z∈[-80,+105]mm · 2 of 32 slices shown]
[im 1/32]
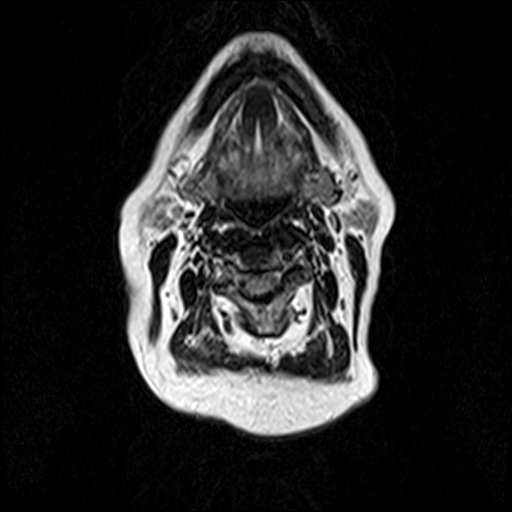
[im 32/32]
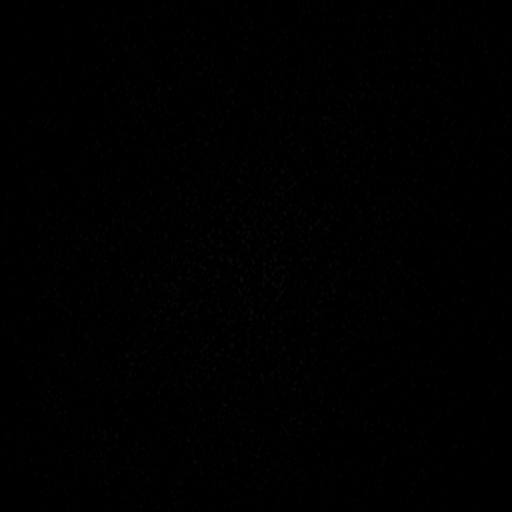

[Series 8: t2_tse_tra · axial · 5.0mm · 0.60mm/px · z∈[-62,+88]mm · 2 of 26 slices shown]
[im 1/26]
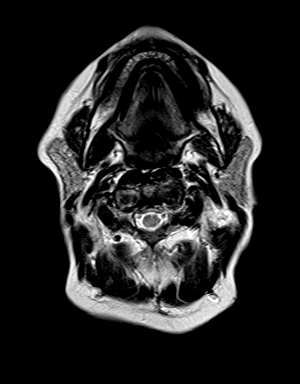
[im 26/26]
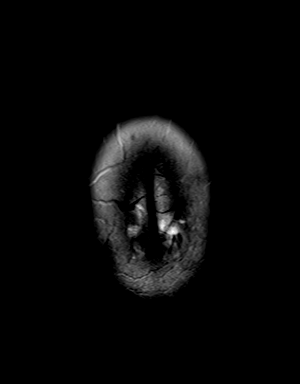

[Series 10: swi_images · axial · 2.0mm · 0.90mm/px · z∈[-66,+92]mm · 5 of 80 slices shown]
[im 1/80]
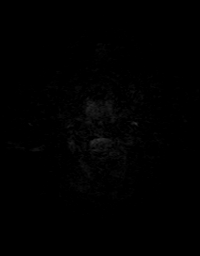
[im 20/80]
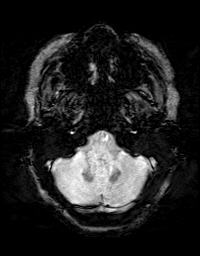
[im 40/80]
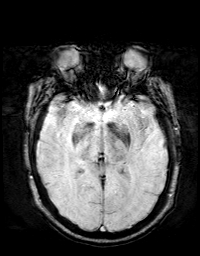
[im 60/80]
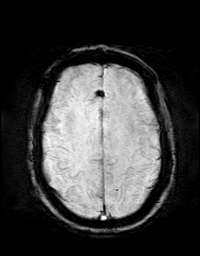
[im 80/80]
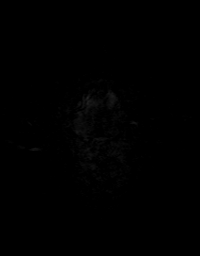

[Series 11: t1_mpr_tra · axial · 1.0mm · 0.72mm/px · z∈[-58,+84]mm · 9 of 144 slices shown]
[im 1/144]
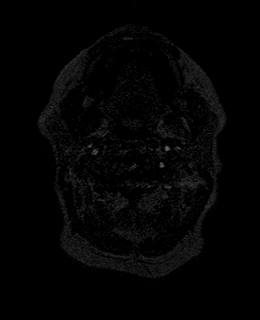
[im 18/144]
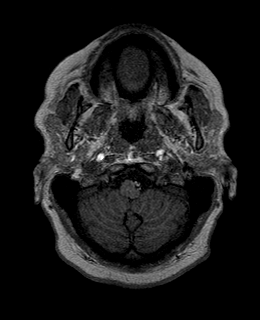
[im 36/144]
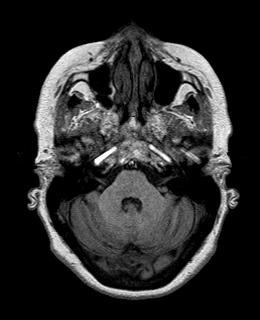
[im 54/144]
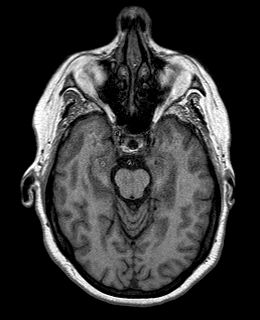
[im 72/144]
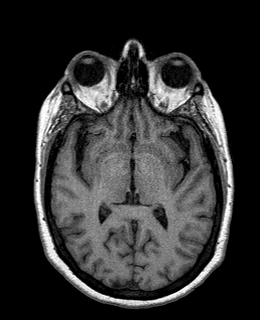
[im 90/144]
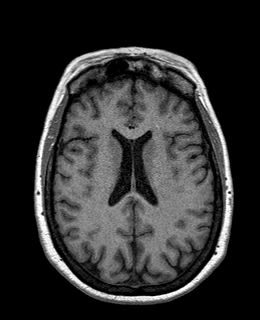
[im 108/144]
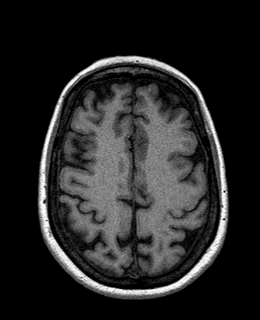
[im 126/144]
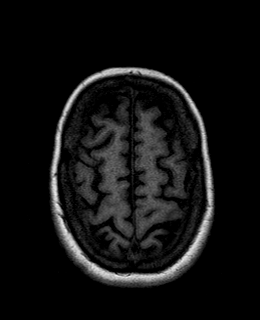
[im 144/144]
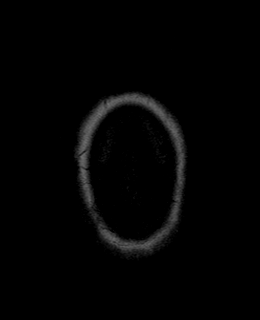

[Series 13: T2 · coronal · 3.0mm · 0.56mm/px · 2 of 28 slices shown]
[im 1/28]
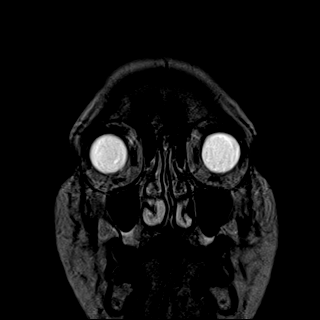
[im 28/28]
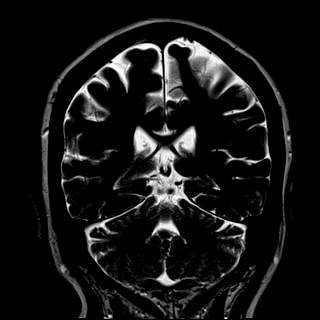

[Series 14: T2 post-contrast · coronal · 5.0mm · 0.45mm/px · 2 of 30 slices shown]
[im 1/30]
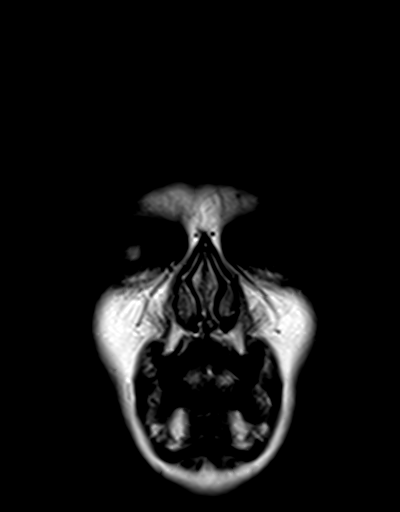
[im 30/30]
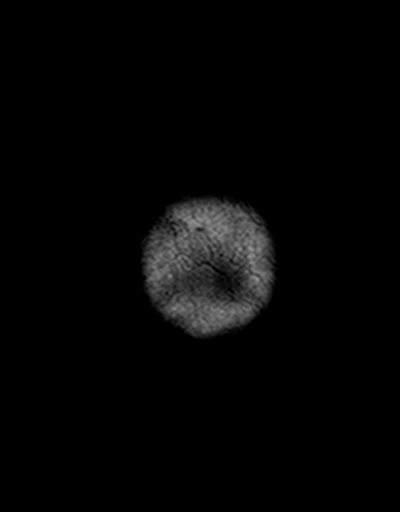

[Series 15: post t1_mpr_tra · axial · 1.0mm · 0.72mm/px · z∈[-58,+84]mm · 9 of 144 slices shown]
[im 1/144]
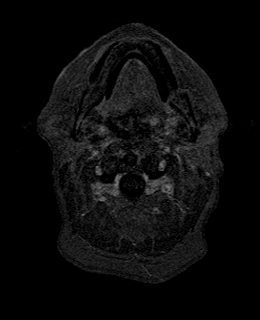
[im 18/144]
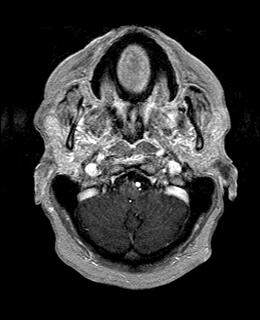
[im 36/144]
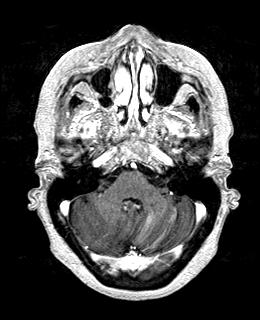
[im 54/144]
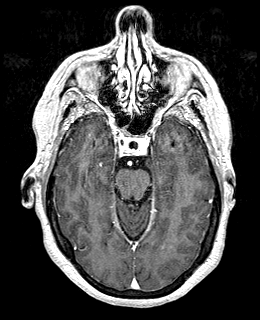
[im 72/144]
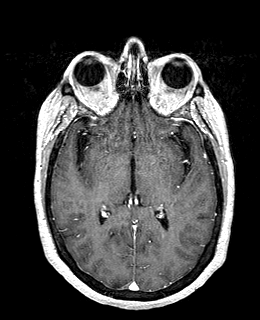
[im 90/144]
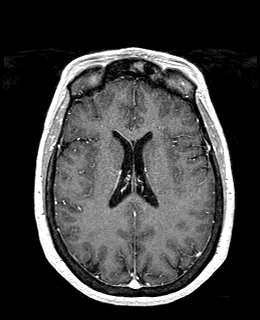
[im 108/144]
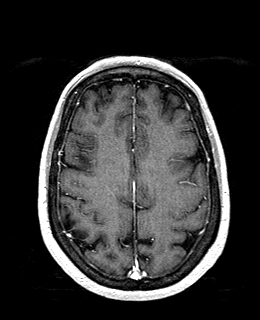
[im 126/144]
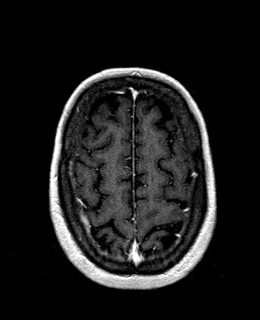
[im 144/144]
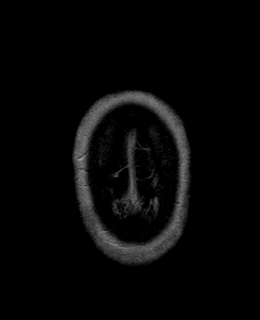

[Series 16: T1 post-contrast · coronal · 5.0mm · 0.72mm/px · 2 of 30 slices shown]
[im 1/30]
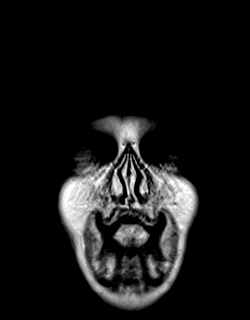
[im 30/30]
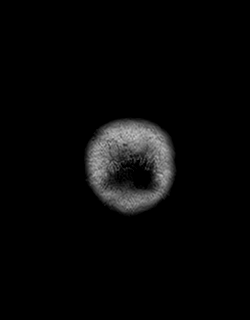

[48 of 48 positions shown; findings below may reference images not displayed]

FINDINGS: Brain: No evidence for acute infarction, hemorrhage, mass lesion,
hydrocephalus, or extra-axial fluid. Mild premature for age cerebral
and cerebellar atrophy. No significant white matter disease.

Thin-section coronal imaging through the temporal lobes demonstrates
no asymmetry or mass. Normal hippocampi.

Post infusion, no abnormal enhancement of the brain or meninges.

Vascular: Flow voids are preserved. Susceptibility within the
anterior interhemispheric region, related to ossification of the
falx, non worrisome.

Skull and upper cervical spine: Normal marrow signal.

Sinuses/Orbits: No layering sinus fluid.  Negative orbits.

Other: None.
IMPRESSION: Mild atrophy. No acute intracranial findings. No abnormal
postcontrast enhancement.

## 2018-11-09 DIAGNOSIS — Z1231 Encounter for screening mammogram for malignant neoplasm of breast: Secondary | ICD-10-CM | POA: Diagnosis not present

## 2018-11-09 DIAGNOSIS — R569 Unspecified convulsions: Secondary | ICD-10-CM | POA: Diagnosis not present

## 2018-11-09 DIAGNOSIS — E785 Hyperlipidemia, unspecified: Secondary | ICD-10-CM | POA: Diagnosis not present

## 2018-11-09 DIAGNOSIS — Z124 Encounter for screening for malignant neoplasm of cervix: Secondary | ICD-10-CM | POA: Diagnosis not present

## 2018-11-09 DIAGNOSIS — N941 Unspecified dyspareunia: Secondary | ICD-10-CM | POA: Diagnosis not present

## 2018-11-09 DIAGNOSIS — R7989 Other specified abnormal findings of blood chemistry: Secondary | ICD-10-CM | POA: Diagnosis not present

## 2018-11-09 DIAGNOSIS — L989 Disorder of the skin and subcutaneous tissue, unspecified: Secondary | ICD-10-CM | POA: Diagnosis not present

## 2018-11-09 DIAGNOSIS — Z87891 Personal history of nicotine dependence: Secondary | ICD-10-CM | POA: Diagnosis not present

## 2018-11-09 DIAGNOSIS — I2510 Atherosclerotic heart disease of native coronary artery without angina pectoris: Secondary | ICD-10-CM | POA: Diagnosis not present

## 2018-11-09 DIAGNOSIS — F319 Bipolar disorder, unspecified: Secondary | ICD-10-CM | POA: Diagnosis not present

## 2018-11-25 DIAGNOSIS — M5416 Radiculopathy, lumbar region: Secondary | ICD-10-CM | POA: Diagnosis not present

## 2018-11-25 DIAGNOSIS — Z79891 Long term (current) use of opiate analgesic: Secondary | ICD-10-CM | POA: Diagnosis not present

## 2018-11-25 DIAGNOSIS — Z79899 Other long term (current) drug therapy: Secondary | ICD-10-CM | POA: Diagnosis not present

## 2018-11-25 DIAGNOSIS — G8929 Other chronic pain: Secondary | ICD-10-CM | POA: Diagnosis not present

## 2018-11-25 DIAGNOSIS — Z6827 Body mass index (BMI) 27.0-27.9, adult: Secondary | ICD-10-CM | POA: Diagnosis not present

## 2018-12-12 DIAGNOSIS — Z79899 Other long term (current) drug therapy: Secondary | ICD-10-CM | POA: Diagnosis not present

## 2018-12-12 DIAGNOSIS — N941 Unspecified dyspareunia: Secondary | ICD-10-CM | POA: Diagnosis not present

## 2018-12-12 DIAGNOSIS — N8111 Cystocele, midline: Secondary | ICD-10-CM | POA: Diagnosis not present

## 2018-12-12 DIAGNOSIS — Z62810 Personal history of physical and sexual abuse in childhood: Secondary | ICD-10-CM | POA: Diagnosis not present

## 2018-12-12 DIAGNOSIS — N3946 Mixed incontinence: Secondary | ICD-10-CM | POA: Diagnosis not present

## 2018-12-14 DIAGNOSIS — R195 Other fecal abnormalities: Secondary | ICD-10-CM | POA: Diagnosis not present

## 2018-12-14 DIAGNOSIS — K219 Gastro-esophageal reflux disease without esophagitis: Secondary | ICD-10-CM | POA: Diagnosis not present

## 2018-12-14 DIAGNOSIS — K591 Functional diarrhea: Secondary | ICD-10-CM | POA: Diagnosis not present

## 2018-12-20 DIAGNOSIS — Z79891 Long term (current) use of opiate analgesic: Secondary | ICD-10-CM | POA: Diagnosis not present

## 2018-12-20 DIAGNOSIS — M5416 Radiculopathy, lumbar region: Secondary | ICD-10-CM | POA: Diagnosis not present

## 2018-12-20 DIAGNOSIS — G8929 Other chronic pain: Secondary | ICD-10-CM | POA: Diagnosis not present

## 2018-12-20 DIAGNOSIS — F1120 Opioid dependence, uncomplicated: Secondary | ICD-10-CM | POA: Diagnosis not present

## 2018-12-20 DIAGNOSIS — Z79899 Other long term (current) drug therapy: Secondary | ICD-10-CM | POA: Diagnosis not present

## 2018-12-27 DIAGNOSIS — B9681 Helicobacter pylori [H. pylori] as the cause of diseases classified elsewhere: Secondary | ICD-10-CM | POA: Diagnosis not present

## 2018-12-27 DIAGNOSIS — K760 Fatty (change of) liver, not elsewhere classified: Secondary | ICD-10-CM | POA: Diagnosis not present

## 2018-12-27 DIAGNOSIS — K644 Residual hemorrhoidal skin tags: Secondary | ICD-10-CM | POA: Diagnosis not present

## 2018-12-27 DIAGNOSIS — K219 Gastro-esophageal reflux disease without esophagitis: Secondary | ICD-10-CM | POA: Diagnosis not present

## 2018-12-27 DIAGNOSIS — E119 Type 2 diabetes mellitus without complications: Secondary | ICD-10-CM | POA: Diagnosis not present

## 2018-12-27 DIAGNOSIS — J449 Chronic obstructive pulmonary disease, unspecified: Secondary | ICD-10-CM | POA: Diagnosis not present

## 2018-12-27 DIAGNOSIS — K2970 Gastritis, unspecified, without bleeding: Secondary | ICD-10-CM | POA: Diagnosis not present

## 2018-12-27 DIAGNOSIS — K591 Functional diarrhea: Secondary | ICD-10-CM | POA: Diagnosis not present

## 2018-12-27 DIAGNOSIS — E7800 Pure hypercholesterolemia, unspecified: Secondary | ICD-10-CM | POA: Diagnosis not present

## 2018-12-27 DIAGNOSIS — R195 Other fecal abnormalities: Secondary | ICD-10-CM | POA: Diagnosis not present

## 2018-12-27 DIAGNOSIS — F319 Bipolar disorder, unspecified: Secondary | ICD-10-CM | POA: Diagnosis not present

## 2019-01-03 DIAGNOSIS — Z1231 Encounter for screening mammogram for malignant neoplasm of breast: Secondary | ICD-10-CM | POA: Diagnosis not present

## 2019-01-03 DIAGNOSIS — R928 Other abnormal and inconclusive findings on diagnostic imaging of breast: Secondary | ICD-10-CM | POA: Diagnosis not present

## 2019-01-16 DIAGNOSIS — R928 Other abnormal and inconclusive findings on diagnostic imaging of breast: Secondary | ICD-10-CM | POA: Diagnosis not present

## 2019-01-16 DIAGNOSIS — R921 Mammographic calcification found on diagnostic imaging of breast: Secondary | ICD-10-CM | POA: Diagnosis not present

## 2019-01-18 DIAGNOSIS — N642 Atrophy of breast: Secondary | ICD-10-CM | POA: Diagnosis not present

## 2019-01-18 DIAGNOSIS — N6310 Unspecified lump in the right breast, unspecified quadrant: Secondary | ICD-10-CM | POA: Diagnosis not present

## 2019-01-18 DIAGNOSIS — R921 Mammographic calcification found on diagnostic imaging of breast: Secondary | ICD-10-CM | POA: Diagnosis not present

## 2019-01-18 DIAGNOSIS — D241 Benign neoplasm of right breast: Secondary | ICD-10-CM | POA: Diagnosis not present

## 2019-01-19 DIAGNOSIS — M5416 Radiculopathy, lumbar region: Secondary | ICD-10-CM | POA: Diagnosis not present

## 2019-01-19 DIAGNOSIS — G8929 Other chronic pain: Secondary | ICD-10-CM | POA: Diagnosis not present

## 2019-01-19 DIAGNOSIS — Z6825 Body mass index (BMI) 25.0-25.9, adult: Secondary | ICD-10-CM | POA: Diagnosis not present

## 2019-01-19 DIAGNOSIS — Z79891 Long term (current) use of opiate analgesic: Secondary | ICD-10-CM | POA: Diagnosis not present

## 2019-02-03 DIAGNOSIS — R7989 Other specified abnormal findings of blood chemistry: Secondary | ICD-10-CM | POA: Diagnosis not present

## 2019-02-07 DIAGNOSIS — N8111 Cystocele, midline: Secondary | ICD-10-CM | POA: Diagnosis not present

## 2019-02-07 DIAGNOSIS — N952 Postmenopausal atrophic vaginitis: Secondary | ICD-10-CM | POA: Diagnosis not present

## 2019-02-07 DIAGNOSIS — N895 Stricture and atresia of vagina: Secondary | ICD-10-CM | POA: Diagnosis not present

## 2019-02-07 DIAGNOSIS — R159 Full incontinence of feces: Secondary | ICD-10-CM | POA: Diagnosis not present

## 2019-02-07 DIAGNOSIS — N3946 Mixed incontinence: Secondary | ICD-10-CM | POA: Diagnosis not present

## 2019-02-14 DIAGNOSIS — G40901 Epilepsy, unspecified, not intractable, with status epilepticus: Secondary | ICD-10-CM | POA: Diagnosis not present

## 2019-02-14 DIAGNOSIS — J449 Chronic obstructive pulmonary disease, unspecified: Secondary | ICD-10-CM | POA: Diagnosis not present

## 2019-02-14 DIAGNOSIS — E139 Other specified diabetes mellitus without complications: Secondary | ICD-10-CM | POA: Diagnosis not present

## 2019-02-14 DIAGNOSIS — Z8679 Personal history of other diseases of the circulatory system: Secondary | ICD-10-CM | POA: Diagnosis not present

## 2019-03-02 DIAGNOSIS — F1120 Opioid dependence, uncomplicated: Secondary | ICD-10-CM | POA: Diagnosis not present

## 2019-03-02 DIAGNOSIS — M5416 Radiculopathy, lumbar region: Secondary | ICD-10-CM | POA: Diagnosis not present

## 2019-03-02 DIAGNOSIS — Z79891 Long term (current) use of opiate analgesic: Secondary | ICD-10-CM | POA: Diagnosis not present

## 2019-03-02 DIAGNOSIS — Z79899 Other long term (current) drug therapy: Secondary | ICD-10-CM | POA: Diagnosis not present

## 2019-03-02 DIAGNOSIS — G8929 Other chronic pain: Secondary | ICD-10-CM | POA: Diagnosis not present

## 2019-03-10 DIAGNOSIS — I2510 Atherosclerotic heart disease of native coronary artery without angina pectoris: Secondary | ICD-10-CM | POA: Diagnosis not present

## 2019-03-10 DIAGNOSIS — J449 Chronic obstructive pulmonary disease, unspecified: Secondary | ICD-10-CM | POA: Diagnosis not present

## 2019-03-10 DIAGNOSIS — R569 Unspecified convulsions: Secondary | ICD-10-CM | POA: Diagnosis not present

## 2019-03-10 DIAGNOSIS — E785 Hyperlipidemia, unspecified: Secondary | ICD-10-CM | POA: Diagnosis not present

## 2019-03-10 DIAGNOSIS — N941 Unspecified dyspareunia: Secondary | ICD-10-CM | POA: Diagnosis not present

## 2019-03-10 DIAGNOSIS — F319 Bipolar disorder, unspecified: Secondary | ICD-10-CM | POA: Diagnosis not present

## 2019-03-10 DIAGNOSIS — E1169 Type 2 diabetes mellitus with other specified complication: Secondary | ICD-10-CM | POA: Diagnosis not present

## 2019-03-10 DIAGNOSIS — Z87891 Personal history of nicotine dependence: Secondary | ICD-10-CM | POA: Diagnosis not present

## 2019-03-10 DIAGNOSIS — L989 Disorder of the skin and subcutaneous tissue, unspecified: Secondary | ICD-10-CM | POA: Diagnosis not present

## 2019-03-17 DIAGNOSIS — G40901 Epilepsy, unspecified, not intractable, with status epilepticus: Secondary | ICD-10-CM | POA: Diagnosis not present

## 2019-03-17 DIAGNOSIS — Z8679 Personal history of other diseases of the circulatory system: Secondary | ICD-10-CM | POA: Diagnosis not present

## 2019-03-17 DIAGNOSIS — J449 Chronic obstructive pulmonary disease, unspecified: Secondary | ICD-10-CM | POA: Diagnosis not present

## 2019-03-17 DIAGNOSIS — E139 Other specified diabetes mellitus without complications: Secondary | ICD-10-CM | POA: Diagnosis not present

## 2019-03-29 ENCOUNTER — Encounter: Payer: Self-pay | Admitting: Neurology

## 2019-04-04 DIAGNOSIS — M5416 Radiculopathy, lumbar region: Secondary | ICD-10-CM | POA: Diagnosis not present

## 2019-04-04 DIAGNOSIS — G8929 Other chronic pain: Secondary | ICD-10-CM | POA: Diagnosis not present

## 2019-04-04 DIAGNOSIS — F17210 Nicotine dependence, cigarettes, uncomplicated: Secondary | ICD-10-CM | POA: Diagnosis not present

## 2019-04-04 DIAGNOSIS — Z79891 Long term (current) use of opiate analgesic: Secondary | ICD-10-CM | POA: Diagnosis not present

## 2019-04-04 DIAGNOSIS — R300 Dysuria: Secondary | ICD-10-CM | POA: Diagnosis not present

## 2019-04-04 DIAGNOSIS — F1120 Opioid dependence, uncomplicated: Secondary | ICD-10-CM | POA: Diagnosis not present

## 2019-04-04 DIAGNOSIS — Z79899 Other long term (current) drug therapy: Secondary | ICD-10-CM | POA: Diagnosis not present

## 2019-04-10 ENCOUNTER — Ambulatory Visit: Payer: Medicare HMO | Admitting: Neurology

## 2019-04-14 DIAGNOSIS — Z0000 Encounter for general adult medical examination without abnormal findings: Secondary | ICD-10-CM | POA: Diagnosis not present

## 2019-04-14 DIAGNOSIS — Z9181 History of falling: Secondary | ICD-10-CM | POA: Diagnosis not present

## 2019-04-14 DIAGNOSIS — E785 Hyperlipidemia, unspecified: Secondary | ICD-10-CM | POA: Diagnosis not present

## 2019-04-14 DIAGNOSIS — Z1331 Encounter for screening for depression: Secondary | ICD-10-CM | POA: Diagnosis not present

## 2019-04-16 DIAGNOSIS — J449 Chronic obstructive pulmonary disease, unspecified: Secondary | ICD-10-CM | POA: Diagnosis not present

## 2019-04-16 DIAGNOSIS — G40901 Epilepsy, unspecified, not intractable, with status epilepticus: Secondary | ICD-10-CM | POA: Diagnosis not present

## 2019-04-16 DIAGNOSIS — Z8679 Personal history of other diseases of the circulatory system: Secondary | ICD-10-CM | POA: Diagnosis not present

## 2019-04-16 DIAGNOSIS — E139 Other specified diabetes mellitus without complications: Secondary | ICD-10-CM | POA: Diagnosis not present

## 2019-05-04 DIAGNOSIS — F17210 Nicotine dependence, cigarettes, uncomplicated: Secondary | ICD-10-CM | POA: Diagnosis not present

## 2019-05-04 DIAGNOSIS — G8929 Other chronic pain: Secondary | ICD-10-CM | POA: Diagnosis not present

## 2019-05-04 DIAGNOSIS — Z79899 Other long term (current) drug therapy: Secondary | ICD-10-CM | POA: Diagnosis not present

## 2019-05-04 DIAGNOSIS — Z79891 Long term (current) use of opiate analgesic: Secondary | ICD-10-CM | POA: Diagnosis not present

## 2019-05-04 DIAGNOSIS — M5136 Other intervertebral disc degeneration, lumbar region: Secondary | ICD-10-CM | POA: Diagnosis not present

## 2019-05-04 DIAGNOSIS — M5416 Radiculopathy, lumbar region: Secondary | ICD-10-CM | POA: Diagnosis not present

## 2019-05-16 DIAGNOSIS — K219 Gastro-esophageal reflux disease without esophagitis: Secondary | ICD-10-CM | POA: Diagnosis not present

## 2019-05-16 DIAGNOSIS — E1169 Type 2 diabetes mellitus with other specified complication: Secondary | ICD-10-CM | POA: Diagnosis not present

## 2019-05-16 DIAGNOSIS — Z87891 Personal history of nicotine dependence: Secondary | ICD-10-CM | POA: Diagnosis not present

## 2019-05-16 DIAGNOSIS — I2510 Atherosclerotic heart disease of native coronary artery without angina pectoris: Secondary | ICD-10-CM | POA: Diagnosis not present

## 2019-05-16 DIAGNOSIS — J449 Chronic obstructive pulmonary disease, unspecified: Secondary | ICD-10-CM | POA: Diagnosis not present

## 2019-05-16 DIAGNOSIS — F319 Bipolar disorder, unspecified: Secondary | ICD-10-CM | POA: Diagnosis not present

## 2019-05-16 DIAGNOSIS — G4700 Insomnia, unspecified: Secondary | ICD-10-CM | POA: Diagnosis not present

## 2019-05-16 DIAGNOSIS — E785 Hyperlipidemia, unspecified: Secondary | ICD-10-CM | POA: Diagnosis not present

## 2019-05-16 DIAGNOSIS — R569 Unspecified convulsions: Secondary | ICD-10-CM | POA: Diagnosis not present

## 2019-05-17 DIAGNOSIS — J449 Chronic obstructive pulmonary disease, unspecified: Secondary | ICD-10-CM | POA: Diagnosis not present

## 2019-05-17 DIAGNOSIS — E139 Other specified diabetes mellitus without complications: Secondary | ICD-10-CM | POA: Diagnosis not present

## 2019-05-17 DIAGNOSIS — Z8679 Personal history of other diseases of the circulatory system: Secondary | ICD-10-CM | POA: Diagnosis not present

## 2019-05-17 DIAGNOSIS — G40901 Epilepsy, unspecified, not intractable, with status epilepticus: Secondary | ICD-10-CM | POA: Diagnosis not present

## 2019-06-02 DIAGNOSIS — G8929 Other chronic pain: Secondary | ICD-10-CM | POA: Diagnosis not present

## 2019-06-02 DIAGNOSIS — Z79899 Other long term (current) drug therapy: Secondary | ICD-10-CM | POA: Diagnosis not present

## 2019-06-02 DIAGNOSIS — F17210 Nicotine dependence, cigarettes, uncomplicated: Secondary | ICD-10-CM | POA: Diagnosis not present

## 2019-06-02 DIAGNOSIS — N951 Menopausal and female climacteric states: Secondary | ICD-10-CM | POA: Diagnosis not present

## 2019-06-02 DIAGNOSIS — E349 Endocrine disorder, unspecified: Secondary | ICD-10-CM | POA: Diagnosis not present

## 2019-06-02 DIAGNOSIS — E119 Type 2 diabetes mellitus without complications: Secondary | ICD-10-CM | POA: Diagnosis not present

## 2019-06-02 DIAGNOSIS — M5416 Radiculopathy, lumbar region: Secondary | ICD-10-CM | POA: Diagnosis not present

## 2019-06-16 DIAGNOSIS — Z8679 Personal history of other diseases of the circulatory system: Secondary | ICD-10-CM | POA: Diagnosis not present

## 2019-06-16 DIAGNOSIS — E139 Other specified diabetes mellitus without complications: Secondary | ICD-10-CM | POA: Diagnosis not present

## 2019-06-16 DIAGNOSIS — G40901 Epilepsy, unspecified, not intractable, with status epilepticus: Secondary | ICD-10-CM | POA: Diagnosis not present

## 2019-06-16 DIAGNOSIS — J449 Chronic obstructive pulmonary disease, unspecified: Secondary | ICD-10-CM | POA: Diagnosis not present

## 2019-06-22 DIAGNOSIS — G8929 Other chronic pain: Secondary | ICD-10-CM | POA: Diagnosis not present

## 2019-06-22 DIAGNOSIS — E119 Type 2 diabetes mellitus without complications: Secondary | ICD-10-CM | POA: Diagnosis not present

## 2019-06-22 DIAGNOSIS — M5416 Radiculopathy, lumbar region: Secondary | ICD-10-CM | POA: Diagnosis not present

## 2019-06-22 DIAGNOSIS — Z79899 Other long term (current) drug therapy: Secondary | ICD-10-CM | POA: Diagnosis not present

## 2019-06-22 DIAGNOSIS — Z1159 Encounter for screening for other viral diseases: Secondary | ICD-10-CM | POA: Diagnosis not present

## 2019-06-22 DIAGNOSIS — F17210 Nicotine dependence, cigarettes, uncomplicated: Secondary | ICD-10-CM | POA: Diagnosis not present

## 2019-07-11 DIAGNOSIS — G4700 Insomnia, unspecified: Secondary | ICD-10-CM | POA: Diagnosis not present

## 2019-07-11 DIAGNOSIS — R569 Unspecified convulsions: Secondary | ICD-10-CM | POA: Diagnosis not present

## 2019-07-11 DIAGNOSIS — F319 Bipolar disorder, unspecified: Secondary | ICD-10-CM | POA: Diagnosis not present

## 2019-07-11 DIAGNOSIS — E785 Hyperlipidemia, unspecified: Secondary | ICD-10-CM | POA: Diagnosis not present

## 2019-07-11 DIAGNOSIS — Z87891 Personal history of nicotine dependence: Secondary | ICD-10-CM | POA: Diagnosis not present

## 2019-07-11 DIAGNOSIS — J449 Chronic obstructive pulmonary disease, unspecified: Secondary | ICD-10-CM | POA: Diagnosis not present

## 2019-07-11 DIAGNOSIS — I2510 Atherosclerotic heart disease of native coronary artery without angina pectoris: Secondary | ICD-10-CM | POA: Diagnosis not present

## 2019-07-11 DIAGNOSIS — R21 Rash and other nonspecific skin eruption: Secondary | ICD-10-CM | POA: Diagnosis not present

## 2019-07-11 DIAGNOSIS — G40909 Epilepsy, unspecified, not intractable, without status epilepticus: Secondary | ICD-10-CM | POA: Diagnosis not present

## 2019-07-11 DIAGNOSIS — E1169 Type 2 diabetes mellitus with other specified complication: Secondary | ICD-10-CM | POA: Diagnosis not present

## 2019-07-17 DIAGNOSIS — Z8679 Personal history of other diseases of the circulatory system: Secondary | ICD-10-CM | POA: Diagnosis not present

## 2019-07-17 DIAGNOSIS — J449 Chronic obstructive pulmonary disease, unspecified: Secondary | ICD-10-CM | POA: Diagnosis not present

## 2019-07-17 DIAGNOSIS — E139 Other specified diabetes mellitus without complications: Secondary | ICD-10-CM | POA: Diagnosis not present

## 2019-07-17 DIAGNOSIS — G40901 Epilepsy, unspecified, not intractable, with status epilepticus: Secondary | ICD-10-CM | POA: Diagnosis not present

## 2019-07-28 DIAGNOSIS — G8929 Other chronic pain: Secondary | ICD-10-CM | POA: Diagnosis not present

## 2019-07-28 DIAGNOSIS — Z1159 Encounter for screening for other viral diseases: Secondary | ICD-10-CM | POA: Diagnosis not present

## 2019-07-28 DIAGNOSIS — Z79899 Other long term (current) drug therapy: Secondary | ICD-10-CM | POA: Diagnosis not present

## 2019-07-28 DIAGNOSIS — Z79891 Long term (current) use of opiate analgesic: Secondary | ICD-10-CM | POA: Diagnosis not present

## 2019-07-28 DIAGNOSIS — F17210 Nicotine dependence, cigarettes, uncomplicated: Secondary | ICD-10-CM | POA: Diagnosis not present

## 2019-07-28 DIAGNOSIS — M5416 Radiculopathy, lumbar region: Secondary | ICD-10-CM | POA: Diagnosis not present

## 2019-07-28 DIAGNOSIS — E119 Type 2 diabetes mellitus without complications: Secondary | ICD-10-CM | POA: Diagnosis not present

## 2019-08-17 DIAGNOSIS — Z8679 Personal history of other diseases of the circulatory system: Secondary | ICD-10-CM | POA: Diagnosis not present

## 2019-08-17 DIAGNOSIS — J449 Chronic obstructive pulmonary disease, unspecified: Secondary | ICD-10-CM | POA: Diagnosis not present

## 2019-08-17 DIAGNOSIS — E139 Other specified diabetes mellitus without complications: Secondary | ICD-10-CM | POA: Diagnosis not present

## 2019-08-17 DIAGNOSIS — G40901 Epilepsy, unspecified, not intractable, with status epilepticus: Secondary | ICD-10-CM | POA: Diagnosis not present

## 2019-08-25 DIAGNOSIS — Z79899 Other long term (current) drug therapy: Secondary | ICD-10-CM | POA: Diagnosis not present

## 2019-08-25 DIAGNOSIS — F17210 Nicotine dependence, cigarettes, uncomplicated: Secondary | ICD-10-CM | POA: Diagnosis not present

## 2019-08-25 DIAGNOSIS — R5383 Other fatigue: Secondary | ICD-10-CM | POA: Diagnosis not present

## 2019-08-25 DIAGNOSIS — Z1159 Encounter for screening for other viral diseases: Secondary | ICD-10-CM | POA: Diagnosis not present

## 2019-08-25 DIAGNOSIS — Z23 Encounter for immunization: Secondary | ICD-10-CM | POA: Diagnosis not present

## 2019-08-25 DIAGNOSIS — E559 Vitamin D deficiency, unspecified: Secondary | ICD-10-CM | POA: Diagnosis not present

## 2019-08-25 DIAGNOSIS — E7800 Pure hypercholesterolemia, unspecified: Secondary | ICD-10-CM | POA: Diagnosis not present

## 2019-08-25 DIAGNOSIS — G8929 Other chronic pain: Secondary | ICD-10-CM | POA: Diagnosis not present

## 2019-08-25 DIAGNOSIS — R0602 Shortness of breath: Secondary | ICD-10-CM | POA: Diagnosis not present

## 2019-08-25 DIAGNOSIS — M5416 Radiculopathy, lumbar region: Secondary | ICD-10-CM | POA: Diagnosis not present

## 2019-08-25 DIAGNOSIS — M25512 Pain in left shoulder: Secondary | ICD-10-CM | POA: Diagnosis not present

## 2019-08-25 DIAGNOSIS — E119 Type 2 diabetes mellitus without complications: Secondary | ICD-10-CM | POA: Diagnosis not present

## 2019-09-16 DIAGNOSIS — G40901 Epilepsy, unspecified, not intractable, with status epilepticus: Secondary | ICD-10-CM | POA: Diagnosis not present

## 2019-09-16 DIAGNOSIS — E139 Other specified diabetes mellitus without complications: Secondary | ICD-10-CM | POA: Diagnosis not present

## 2019-09-16 DIAGNOSIS — Z8679 Personal history of other diseases of the circulatory system: Secondary | ICD-10-CM | POA: Diagnosis not present

## 2019-09-16 DIAGNOSIS — J449 Chronic obstructive pulmonary disease, unspecified: Secondary | ICD-10-CM | POA: Diagnosis not present

## 2019-09-19 DIAGNOSIS — M5441 Lumbago with sciatica, right side: Secondary | ICD-10-CM | POA: Diagnosis not present

## 2019-09-19 DIAGNOSIS — G8929 Other chronic pain: Secondary | ICD-10-CM | POA: Diagnosis not present

## 2019-09-19 DIAGNOSIS — G44221 Chronic tension-type headache, intractable: Secondary | ICD-10-CM | POA: Diagnosis not present

## 2019-09-19 DIAGNOSIS — M5442 Lumbago with sciatica, left side: Secondary | ICD-10-CM | POA: Diagnosis not present

## 2019-09-19 DIAGNOSIS — R251 Tremor, unspecified: Secondary | ICD-10-CM | POA: Diagnosis not present

## 2019-09-19 DIAGNOSIS — M542 Cervicalgia: Secondary | ICD-10-CM | POA: Diagnosis not present

## 2019-09-19 DIAGNOSIS — M79602 Pain in left arm: Secondary | ICD-10-CM | POA: Diagnosis not present

## 2019-09-19 DIAGNOSIS — G40909 Epilepsy, unspecified, not intractable, without status epilepticus: Secondary | ICD-10-CM | POA: Diagnosis not present

## 2019-09-20 DIAGNOSIS — G8929 Other chronic pain: Secondary | ICD-10-CM | POA: Diagnosis not present

## 2019-09-20 DIAGNOSIS — F1120 Opioid dependence, uncomplicated: Secondary | ICD-10-CM | POA: Diagnosis not present

## 2019-09-20 DIAGNOSIS — F17210 Nicotine dependence, cigarettes, uncomplicated: Secondary | ICD-10-CM | POA: Diagnosis not present

## 2019-09-20 DIAGNOSIS — E119 Type 2 diabetes mellitus without complications: Secondary | ICD-10-CM | POA: Diagnosis not present

## 2019-09-20 DIAGNOSIS — Z79899 Other long term (current) drug therapy: Secondary | ICD-10-CM | POA: Diagnosis not present

## 2019-09-20 DIAGNOSIS — Z1159 Encounter for screening for other viral diseases: Secondary | ICD-10-CM | POA: Diagnosis not present

## 2019-09-20 DIAGNOSIS — M5416 Radiculopathy, lumbar region: Secondary | ICD-10-CM | POA: Diagnosis not present
